# Patient Record
Sex: Male | Born: 1945 | Race: White | Hispanic: No | Marital: Married | Smoking: Former smoker
Health system: Southern US, Community
[De-identification: ages and names within clinical notes are randomized; demographics above are authoritative.]

## PROBLEM LIST (undated history)

## (undated) DIAGNOSIS — I452 Bifascicular block: Secondary | ICD-10-CM

## (undated) DIAGNOSIS — M1990 Unspecified osteoarthritis, unspecified site: Secondary | ICD-10-CM

## (undated) DIAGNOSIS — E119 Type 2 diabetes mellitus without complications: Secondary | ICD-10-CM

## (undated) DIAGNOSIS — R51 Headache: Secondary | ICD-10-CM

## (undated) DIAGNOSIS — E785 Hyperlipidemia, unspecified: Secondary | ICD-10-CM

## (undated) DIAGNOSIS — IMO0002 Reserved for concepts with insufficient information to code with codable children: Secondary | ICD-10-CM

## (undated) DIAGNOSIS — I10 Essential (primary) hypertension: Secondary | ICD-10-CM

## (undated) DIAGNOSIS — I25119 Atherosclerotic heart disease of native coronary artery with unspecified angina pectoris: Secondary | ICD-10-CM

## (undated) DIAGNOSIS — F329 Major depressive disorder, single episode, unspecified: Secondary | ICD-10-CM

## (undated) DIAGNOSIS — G5600 Carpal tunnel syndrome, unspecified upper limb: Secondary | ICD-10-CM

## (undated) HISTORY — DX: Reserved for concepts with insufficient information to code with codable children: IMO0002

## (undated) HISTORY — DX: Type 2 diabetes mellitus without complications: E11.9

## (undated) HISTORY — DX: Hyperlipidemia, unspecified: E78.5

## (undated) HISTORY — DX: Major depressive disorder, single episode, unspecified: F32.9

## (undated) HISTORY — PX: CARDIAC CATHETERIZATION: SHX172

## (undated) HISTORY — PX: CERVICAL SPINE SURGERY: SHX589

## (undated) HISTORY — DX: Essential (primary) hypertension: I10

## (undated) HISTORY — DX: Carpal tunnel syndrome, unspecified upper limb: G56.00

## (undated) HISTORY — DX: Bifascicular block: I45.2

## (undated) HISTORY — DX: Unspecified osteoarthritis, unspecified site: M19.90

## (undated) HISTORY — DX: Atherosclerotic heart disease of native coronary artery with unspecified angina pectoris: I25.119

## (undated) HISTORY — DX: Headache: R51

## (undated) HISTORY — PX: ELBOW SURGERY: SHX618

## (undated) HISTORY — PX: CORONARY ARTERY BYPASS GRAFT: SHX141

## (undated) HISTORY — PX: BACK SURGERY: SHX140

---

## 2000-10-23 ENCOUNTER — Encounter: Payer: Self-pay | Admitting: Cardiology

## 2000-10-23 ENCOUNTER — Encounter: Payer: Self-pay | Admitting: Thoracic Surgery (Cardiothoracic Vascular Surgery)

## 2000-10-23 ENCOUNTER — Inpatient Hospital Stay (HOSPITAL_COMMUNITY): Admission: EM | Admit: 2000-10-23 | Discharge: 2000-11-03 | Payer: Self-pay | Admitting: Emergency Medicine

## 2000-10-24 ENCOUNTER — Encounter: Payer: Self-pay | Admitting: Thoracic Surgery (Cardiothoracic Vascular Surgery)

## 2000-10-25 ENCOUNTER — Encounter: Payer: Self-pay | Admitting: Thoracic Surgery (Cardiothoracic Vascular Surgery)

## 2000-10-26 ENCOUNTER — Encounter: Payer: Self-pay | Admitting: Thoracic Surgery (Cardiothoracic Vascular Surgery)

## 2000-10-27 ENCOUNTER — Encounter: Payer: Self-pay | Admitting: Thoracic Surgery (Cardiothoracic Vascular Surgery)

## 2000-10-28 ENCOUNTER — Encounter: Payer: Self-pay | Admitting: Thoracic Surgery (Cardiothoracic Vascular Surgery)

## 2000-10-29 ENCOUNTER — Encounter: Payer: Self-pay | Admitting: Thoracic Surgery (Cardiothoracic Vascular Surgery)

## 2000-10-30 ENCOUNTER — Encounter: Payer: Self-pay | Admitting: Thoracic Surgery (Cardiothoracic Vascular Surgery)

## 2000-11-01 ENCOUNTER — Encounter: Payer: Self-pay | Admitting: Thoracic Surgery (Cardiothoracic Vascular Surgery)

## 2003-02-03 ENCOUNTER — Observation Stay (HOSPITAL_COMMUNITY): Admission: RE | Admit: 2003-02-03 | Discharge: 2003-02-04 | Payer: Self-pay | Admitting: Neurological Surgery

## 2003-02-05 ENCOUNTER — Inpatient Hospital Stay (HOSPITAL_COMMUNITY): Admission: EM | Admit: 2003-02-05 | Discharge: 2003-02-07 | Payer: Self-pay | Admitting: Emergency Medicine

## 2003-04-30 ENCOUNTER — Ambulatory Visit (HOSPITAL_BASED_OUTPATIENT_CLINIC_OR_DEPARTMENT_OTHER): Admission: RE | Admit: 2003-04-30 | Discharge: 2003-04-30 | Payer: Self-pay | Admitting: Orthopedic Surgery

## 2003-04-30 ENCOUNTER — Encounter: Admission: RE | Admit: 2003-04-30 | Discharge: 2003-04-30 | Payer: Self-pay | Admitting: Orthopedic Surgery

## 2003-04-30 ENCOUNTER — Ambulatory Visit (HOSPITAL_COMMUNITY): Admission: RE | Admit: 2003-04-30 | Discharge: 2003-04-30 | Payer: Self-pay | Admitting: Orthopedic Surgery

## 2010-09-16 DIAGNOSIS — R51 Headache: Secondary | ICD-10-CM

## 2010-09-16 DIAGNOSIS — R519 Headache, unspecified: Secondary | ICD-10-CM | POA: Insufficient documentation

## 2010-09-16 DIAGNOSIS — M1990 Unspecified osteoarthritis, unspecified site: Secondary | ICD-10-CM | POA: Insufficient documentation

## 2010-09-16 DIAGNOSIS — F32A Depression, unspecified: Secondary | ICD-10-CM

## 2010-09-16 DIAGNOSIS — E119 Type 2 diabetes mellitus without complications: Secondary | ICD-10-CM

## 2010-09-16 DIAGNOSIS — I10 Essential (primary) hypertension: Secondary | ICD-10-CM

## 2010-09-16 DIAGNOSIS — F329 Major depressive disorder, single episode, unspecified: Secondary | ICD-10-CM | POA: Insufficient documentation

## 2010-09-16 HISTORY — DX: Unspecified osteoarthritis, unspecified site: M19.90

## 2010-09-16 HISTORY — DX: Headache, unspecified: R51.9

## 2010-09-16 HISTORY — DX: Depression, unspecified: F32.A

## 2010-09-16 HISTORY — DX: Type 2 diabetes mellitus without complications: E11.9

## 2010-09-16 HISTORY — DX: Essential (primary) hypertension: I10

## 2010-09-20 DIAGNOSIS — IMO0002 Reserved for concepts with insufficient information to code with codable children: Secondary | ICD-10-CM

## 2010-09-20 HISTORY — DX: Reserved for concepts with insufficient information to code with codable children: IMO0002

## 2010-10-14 DIAGNOSIS — G5600 Carpal tunnel syndrome, unspecified upper limb: Secondary | ICD-10-CM

## 2010-10-14 HISTORY — DX: Carpal tunnel syndrome, unspecified upper limb: G56.00

## 2010-11-09 ENCOUNTER — Other Ambulatory Visit: Payer: Self-pay | Admitting: Neurological Surgery

## 2010-11-09 DIAGNOSIS — M47812 Spondylosis without myelopathy or radiculopathy, cervical region: Secondary | ICD-10-CM

## 2010-11-13 ENCOUNTER — Ambulatory Visit
Admission: RE | Admit: 2010-11-13 | Discharge: 2010-11-13 | Disposition: A | Discharge status: Home / Self Care | Payer: Medicare Other | Source: Ambulatory Visit | Attending: Neurological Surgery | Admitting: Neurological Surgery

## 2010-11-13 DIAGNOSIS — M47812 Spondylosis without myelopathy or radiculopathy, cervical region: Secondary | ICD-10-CM

## 2010-11-23 ENCOUNTER — Other Ambulatory Visit (HOSPITAL_COMMUNITY): Payer: Self-pay | Admitting: Neurological Surgery

## 2010-11-23 ENCOUNTER — Ambulatory Visit (HOSPITAL_COMMUNITY)
Admission: RE | Admit: 2010-11-23 | Discharge: 2010-11-23 | Disposition: A | Discharge status: Home / Self Care | Payer: Medicare Other | Source: Ambulatory Visit | Attending: Neurological Surgery | Admitting: Neurological Surgery

## 2010-11-23 ENCOUNTER — Encounter (HOSPITAL_COMMUNITY)
Admission: RE | Admit: 2010-11-23 | Discharge: 2010-11-23 | Disposition: A | Discharge status: Home / Self Care | Payer: Medicare Other | Source: Ambulatory Visit | Attending: Neurological Surgery | Admitting: Neurological Surgery

## 2010-11-23 DIAGNOSIS — M5030 Other cervical disc degeneration, unspecified cervical region: Secondary | ICD-10-CM

## 2010-11-23 DIAGNOSIS — Z01812 Encounter for preprocedural laboratory examination: Secondary | ICD-10-CM | POA: Insufficient documentation

## 2010-11-23 DIAGNOSIS — Z01818 Encounter for other preprocedural examination: Secondary | ICD-10-CM | POA: Insufficient documentation

## 2010-11-23 LAB — BASIC METABOLIC PANEL
BUN: 10 mg/dL (ref 6–23)
CO2: 29 mEq/L (ref 19–32)
Calcium: 9.9 mg/dL (ref 8.4–10.5)
Chloride: 103 mEq/L (ref 96–112)
Creatinine, Ser: 0.87 mg/dL (ref 0.50–1.35)
GFR calc Af Amer: >60 mL/min (ref >60)
GFR calc non Af Amer: >60 mL/min (ref >60)
Glucose, Bld: 104 mg/dL — ABNORMAL HIGH (ref 70–99)
Potassium: 4.3 mEq/L (ref 3.5–5.1)
Sodium: 141 mEq/L (ref 135–145)

## 2010-11-23 LAB — PROTIME-INR
INR: 1.06 (ref 0.00–1.49)
Prothrombin Time: 14 seconds (ref 11.6–15.2)

## 2010-11-23 LAB — CBC
HCT: 45.1 % (ref 39.0–52.0)
Hemoglobin: 15.6 g/dL (ref 13.0–17.0)
MCH: 30.5 pg (ref 26.0–34.0)
MCHC: 34.6 g/dL (ref 30.0–36.0)
MCV: 88.1 fL (ref 78.0–100.0)
Platelets: 251 10*3/uL (ref 150–400)
RBC: 5.12 MIL/uL (ref 4.22–5.81)
RDW: 12.5 % (ref 11.5–15.5)
WBC: 13.2 10*3/uL — ABNORMAL HIGH (ref 4.0–10.5)

## 2010-11-23 LAB — SURGICAL PCR SCREEN
MRSA, PCR: NEGATIVE
Staphylococcus aureus: NEGATIVE

## 2010-11-30 ENCOUNTER — Ambulatory Visit (HOSPITAL_COMMUNITY): Payer: Medicare Other

## 2010-11-30 ENCOUNTER — Inpatient Hospital Stay (HOSPITAL_COMMUNITY)
Admission: RE | Admit: 2010-11-30 | Discharge: 2010-12-01 | DRG: 473 | Disposition: A | Discharge status: Home / Self Care | Payer: Medicare Other | Source: Ambulatory Visit | Attending: Neurological Surgery | Admitting: Neurological Surgery

## 2010-11-30 DIAGNOSIS — Z87891 Personal history of nicotine dependence: Secondary | ICD-10-CM

## 2010-11-30 DIAGNOSIS — M4712 Other spondylosis with myelopathy, cervical region: Principal | ICD-10-CM | POA: Diagnosis present

## 2010-11-30 DIAGNOSIS — Z7982 Long term (current) use of aspirin: Secondary | ICD-10-CM

## 2010-11-30 DIAGNOSIS — Z79899 Other long term (current) drug therapy: Secondary | ICD-10-CM

## 2010-11-30 DIAGNOSIS — E119 Type 2 diabetes mellitus without complications: Secondary | ICD-10-CM | POA: Diagnosis present

## 2010-11-30 DIAGNOSIS — I2510 Atherosclerotic heart disease of native coronary artery without angina pectoris: Secondary | ICD-10-CM | POA: Diagnosis present

## 2010-11-30 DIAGNOSIS — I10 Essential (primary) hypertension: Secondary | ICD-10-CM | POA: Diagnosis present

## 2010-11-30 LAB — CBC
HCT: 46.1 % (ref 39.0–52.0)
Hemoglobin: 16.1 g/dL (ref 13.0–17.0)
MCH: 30.9 pg (ref 26.0–34.0)
MCHC: 34.9 g/dL (ref 30.0–36.0)
MCV: 88.5 fL (ref 78.0–100.0)
Platelets: 214 10*3/uL (ref 150–400)
RBC: 5.21 MIL/uL (ref 4.22–5.81)
RDW: 12.6 % (ref 11.5–15.5)
WBC: 12.4 10*3/uL — ABNORMAL HIGH (ref 4.0–10.5)

## 2010-11-30 LAB — GLUCOSE, CAPILLARY
Glucose-Capillary: 121 mg/dL — ABNORMAL HIGH (ref 70–99)
Glucose-Capillary: 153 mg/dL — ABNORMAL HIGH (ref 70–99)
Glucose-Capillary: 188 mg/dL — ABNORMAL HIGH (ref 70–99)
Glucose-Capillary: 181 mg/dL — ABNORMAL HIGH (ref 70–99)

## 2010-12-01 LAB — GLUCOSE, CAPILLARY: Glucose-Capillary: 160 mg/dL — ABNORMAL HIGH (ref 70–99)

## 2010-12-02 ENCOUNTER — Other Ambulatory Visit (HOSPITAL_COMMUNITY): Payer: Self-pay

## 2010-12-09 NOTE — Op Note (Signed)
Anthony Mcgee, WORD NO.:  [id number]  MEDICAL RECORD NO.:  [id number]  LOCATION:  3526                         FACILITY:  [facility]  PHYSICIAN:  Stefani Dama, M.D.  DATE OF BIRTH:  [birth year]  DATE OF PROCEDURE:  11/30/2010 DATE OF DISCHARGE:                              OPERATIVE REPORT   PREOPERATIVE DIAGNOSIS:  Cervical spondylosis with myelopathy, C3-C4.  POSTOPERATIVE DIAGNOSIS:  Cervical spondylosis with myelopathy, C3-C4.  PROCEDURE:  Anterior cervical decompression, C3-C4; arthrodesis with structural allograft and Alphatec plate fixation, C3-C4.  SURGEON:  Stefani Dama, MD  FIRST ASSISTANT:  Clydene Fake, MD  ANESTHESIA:  General endotracheal.  INDICATIONS:  Anthony Mcgee is a 65 year old individual who has had significant problems with walking and problems with strength in his upper extremities.  He had substantial problem with neck pain.  He had previous cervical spondylitic disease and has undergone decompression and fusion of the lower cervical segment.  His most recent problem let him to have an MRI of the cervical spine which demonstrates the patient has evidence of myelomalacia at the level of C3-C4 with a large spondylotic ridge secondary to bony osteophytic overgrowth in addition to marked disk degeneration.  He was advised of the urgent need for cervical spinal cord decompression as his condition has been deteriorating over the past number of months.  He is now taken to the operating room for this procedure.  DESCRIPTION OF PROCEDURE:  The patient was brought to the operating room and placed on table in a supine position.  After smooth induction of general endotracheal anesthesia, his neck was placed in a donut horseshoe head holder and extended only slightly.  The neck was then prepped with alcohol and DuraPrep and draped in a sterile fashion.  A transverse incision was created in the neck approximately a centimeter- and-half  above his previously made incision.  Dissection was carried down through the platysma and plane between sternocleidomastoid and the strap muscles were dissected bluntly until the prevertebral space was reached.  The first identifiable disk space was noted to be that of C3- C4 on localizing radiograph.  Then by stripping the longus colli muscle off either side of the midline in this area, a Caspar retractor could be placed underneath the muscular edges.  The disk space was then opened with a #15 blade and a combination of Kerrison rongeurs was used to evacuate the ventral degenerated disk material in this area and then we removed the edge of the osteophytic overgrowth from the ventral aspect of the vertebral body of C3.  The disk space was entered further and a complete diskectomy was carried out.  There was substantial osteophytic overgrowth from the inferior margin body of C3 as there was from the uncinate processes which were markedly hypertrophied.  These were drilled down and large uncinate spurs were removed from both the right and left sides.  This allowed further decompression of the central canal.  There was noted be redundant thickened posterior longitudinal ligament which was then opened with a 2-mm fine tip Kerrison rongeur and then the dissection was carried out underneath the vertebral body of C4 and underneath the vertebral body of  C3 after drilling cephalad to undercut the ventral osteophyte.  The central canal was thus decompressed in the area of the disk space at the C3-C4 level.  Self- retaining disk spreaders were placed from side-to-side to allow better visualization of the right side than the left side and then back to the right side several times in this fashion, so as to allow good decompression to the lateral gutters.  Hemostasis in the epidural spaces was then achieved with some small pledgets of Gelfoam soaked in thrombin which were allowed to sit in tamponade for  a few minutes, they were later irrigated away.  A barrel bit was then used to smooth the edges of the vertebral bodies and then ultimately an 8-mm transgraft was shaved to the appropriate size and configuration and filled with demineralized bone matrix and placed into the center of the interspinous space at C3- C4.  Then the lateral gutters were packed with some more demineralized bone matrix and a 14-mm standard size Alphatec plate of the Trestle variety was placed on the ventral aspect of the vertebral bodies and secured with 14 mm x 4 variable angle screws.  Final radiograph was then obtained to confirm the position of the graft and the nature of the construct when this was verified.  Soft-tissue hemostasis was achieved meticulously, then the platysma was closed with 3-0 Vicryl in interrupted fashion, and 3-0 Vicryl was used in the subcuticular tissues.  Dermabond was placed on the skin.  Blood loss for the procedure was estimated at less than 50 mL.     Stefani Dama, M.D.     Merla Riches  D:  11/30/2010  T:  11/30/2010  Job:  [id number]  Electronically Signed by Barnett Abu M.D. on 12/09/2010 03:17:08 PM

## 2010-12-13 ENCOUNTER — Inpatient Hospital Stay (HOSPITAL_COMMUNITY)
Admission: RE | Admit: 2010-12-13 | Discharge: 2010-12-21 | DRG: 490 | Disposition: A | Discharge status: Skilled Nursing Facility | Payer: Medicare Other | Source: Ambulatory Visit | Attending: Neurological Surgery | Admitting: Neurological Surgery

## 2010-12-13 ENCOUNTER — Inpatient Hospital Stay (HOSPITAL_COMMUNITY): Payer: Medicare Other

## 2010-12-13 DIAGNOSIS — M5126 Other intervertebral disc displacement, lumbar region: Secondary | ICD-10-CM | POA: Diagnosis present

## 2010-12-13 DIAGNOSIS — J4489 Other specified chronic obstructive pulmonary disease: Secondary | ICD-10-CM | POA: Diagnosis present

## 2010-12-13 DIAGNOSIS — J449 Chronic obstructive pulmonary disease, unspecified: Secondary | ICD-10-CM | POA: Diagnosis present

## 2010-12-13 DIAGNOSIS — Z7982 Long term (current) use of aspirin: Secondary | ICD-10-CM

## 2010-12-13 DIAGNOSIS — R339 Retention of urine, unspecified: Secondary | ICD-10-CM | POA: Diagnosis not present

## 2010-12-13 DIAGNOSIS — I4510 Unspecified right bundle-branch block: Secondary | ICD-10-CM | POA: Diagnosis present

## 2010-12-13 DIAGNOSIS — G8250 Quadriplegia, unspecified: Secondary | ICD-10-CM | POA: Diagnosis present

## 2010-12-13 DIAGNOSIS — G8929 Other chronic pain: Secondary | ICD-10-CM | POA: Diagnosis present

## 2010-12-13 DIAGNOSIS — R109 Unspecified abdominal pain: Secondary | ICD-10-CM | POA: Diagnosis present

## 2010-12-13 DIAGNOSIS — D62 Acute posthemorrhagic anemia: Secondary | ICD-10-CM | POA: Diagnosis not present

## 2010-12-13 DIAGNOSIS — M4712 Other spondylosis with myelopathy, cervical region: Secondary | ICD-10-CM | POA: Diagnosis present

## 2010-12-13 DIAGNOSIS — M48062 Spinal stenosis, lumbar region with neurogenic claudication: Principal | ICD-10-CM | POA: Diagnosis present

## 2010-12-13 DIAGNOSIS — Z951 Presence of aortocoronary bypass graft: Secondary | ICD-10-CM

## 2010-12-13 DIAGNOSIS — Z981 Arthrodesis status: Secondary | ICD-10-CM

## 2010-12-13 DIAGNOSIS — I2510 Atherosclerotic heart disease of native coronary artery without angina pectoris: Secondary | ICD-10-CM | POA: Diagnosis present

## 2010-12-13 DIAGNOSIS — I10 Essential (primary) hypertension: Secondary | ICD-10-CM | POA: Diagnosis present

## 2010-12-13 LAB — CBC
HCT: 41.9 % (ref 39.0–52.0)
Hemoglobin: 14.5 g/dL (ref 13.0–17.0)
MCH: 30.5 pg (ref 26.0–34.0)
MCHC: 34.6 g/dL (ref 30.0–36.0)
MCV: 88.2 fL (ref 78.0–100.0)
Platelets: 227 10*3/uL (ref 150–400)
RBC: 4.75 MIL/uL (ref 4.22–5.81)
RDW: 12.7 % (ref 11.5–15.5)
WBC: 9.7 10*3/uL (ref 4.0–10.5)

## 2010-12-13 LAB — BASIC METABOLIC PANEL
BUN: 9 mg/dL (ref 6–23)
CO2: 30 mEq/L (ref 19–32)
Calcium: 9.9 mg/dL (ref 8.4–10.5)
Chloride: 105 mEq/L (ref 96–112)
Creatinine, Ser: 0.86 mg/dL (ref 0.50–1.35)
GFR calc Af Amer: >60 mL/min (ref >60)
GFR calc non Af Amer: >60 mL/min (ref >60)
Glucose, Bld: 116 mg/dL — ABNORMAL HIGH (ref 70–99)
Potassium: 4.6 mEq/L (ref 3.5–5.1)
Sodium: 142 mEq/L (ref 135–145)

## 2010-12-13 LAB — GLUCOSE, CAPILLARY
Glucose-Capillary: 97 mg/dL (ref 70–99)
Glucose-Capillary: 141 mg/dL — ABNORMAL HIGH (ref 70–99)
Glucose-Capillary: 176 mg/dL — ABNORMAL HIGH (ref 70–99)
Glucose-Capillary: 205 mg/dL — ABNORMAL HIGH (ref 70–99)

## 2010-12-14 LAB — GLUCOSE, CAPILLARY
Glucose-Capillary: 115 mg/dL — ABNORMAL HIGH (ref 70–99)
Glucose-Capillary: 142 mg/dL — ABNORMAL HIGH (ref 70–99)

## 2010-12-15 ENCOUNTER — Inpatient Hospital Stay (HOSPITAL_COMMUNITY): Payer: Medicare Other

## 2010-12-15 LAB — CBC
HCT: 35.5 % — ABNORMAL LOW (ref 39.0–52.0)
MCH: 30.2 pg (ref 26.0–34.0)
MCV: 89.2 fL (ref 78.0–100.0)
Platelets: 194 10*3/uL (ref 150–400)
RDW: 12.8 % (ref 11.5–15.5)

## 2010-12-15 LAB — BASIC METABOLIC PANEL
BUN: 11 mg/dL (ref 6–23)
CO2: 28 mEq/L (ref 19–32)
Calcium: 9 mg/dL (ref 8.4–10.5)
Chloride: 106 mEq/L (ref 96–112)
Creatinine, Ser: 0.81 mg/dL (ref 0.50–1.35)
GFR calc Af Amer: >60 mL/min (ref >60)

## 2010-12-15 LAB — GLUCOSE, CAPILLARY
Glucose-Capillary: 103 mg/dL — ABNORMAL HIGH (ref 70–99)
Glucose-Capillary: 132 mg/dL — ABNORMAL HIGH (ref 70–99)
Glucose-Capillary: 182 mg/dL — ABNORMAL HIGH (ref 70–99)

## 2010-12-15 LAB — URINALYSIS, ROUTINE W REFLEX MICROSCOPIC
Bilirubin Urine: NEGATIVE
Ketones, ur: NEGATIVE mg/dL
Nitrite: NEGATIVE
Specific Gravity, Urine: 1.016 (ref 1.005–1.030)
pH: 6.5 (ref 5.0–8.0)

## 2010-12-15 LAB — URINE MICROSCOPIC-ADD ON

## 2010-12-16 LAB — URINE CULTURE
Colony Count: NO GROWTH
Culture  Setup Time: 201209121911
Culture: NO GROWTH

## 2010-12-16 LAB — GLUCOSE, CAPILLARY: Glucose-Capillary: 180 mg/dL — ABNORMAL HIGH (ref 70–99)

## 2010-12-17 LAB — GLUCOSE, CAPILLARY: Glucose-Capillary: 112 mg/dL — ABNORMAL HIGH (ref 70–99)

## 2010-12-18 LAB — GLUCOSE, CAPILLARY
Glucose-Capillary: 116 mg/dL — ABNORMAL HIGH (ref 70–99)
Glucose-Capillary: 111 mg/dL — ABNORMAL HIGH (ref 70–99)

## 2010-12-19 LAB — GLUCOSE, CAPILLARY: Glucose-Capillary: 118 mg/dL — ABNORMAL HIGH (ref 70–99)

## 2010-12-20 LAB — GLUCOSE, CAPILLARY
Glucose-Capillary: 170 mg/dL — ABNORMAL HIGH (ref 70–99)
Glucose-Capillary: 130 mg/dL — ABNORMAL HIGH (ref 70–99)
Glucose-Capillary: 155 mg/dL — ABNORMAL HIGH (ref 70–99)
Glucose-Capillary: 130 mg/dL — ABNORMAL HIGH (ref 70–99)
Glucose-Capillary: 101 mg/dL — ABNORMAL HIGH (ref 70–99)

## 2010-12-21 LAB — GLUCOSE, CAPILLARY: Glucose-Capillary: 128 mg/dL — ABNORMAL HIGH (ref 70–99)

## 2011-02-02 NOTE — Op Note (Addendum)
[name], [name]                ACCOUNT NO.:  [id number]  MEDICAL RECORD NO.:  [id number]  LOCATION:                                 FACILITY:  PHYSICIAN:  Stefani Dama, M.D.  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  PROCEDURE:  Bilateral laminotomies L2-L3 with diskectomy L2-3, right L3- 4 laminotomy, foraminotomy and diskectomy with operating microscope and microdissection technique.  SURGEON:  Stefani Dama, MD  FIRST ASSISTANT:  Danae Orleans. Venetia Maxon, MD  ANESTHESIA:  General endotracheal.  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  He has had previous surgical decompression of the cervical spine C4 down to C7.  He had a large centrally ruptured disk at C3-4 that was decompressed a few weeks ago.  He also complained of severe bilateral lower extremity weakness and on MRI imaging of the lumbar spine was found to have a large centrally herniated disk at L3-4 causing severe central canal compromise and he had a right-sided disk protrusion in addition to advanced spondylitic changes at L3-L4.  He was advised regarding the need for decompression of these areas and now is taken to the operating room to undergo same procedure.  DESCRIPTION OF PROCEDURE:  The patient was brought to the operating room supine on the stretcher.  After smooth induction of general endotracheal anesthesia, he was turned prone.  The back was prepped  with alcohol, DuraPrep, and draped in sterile fashion.  Vertical incision was then made overlying the L3-4 and L2-3 regions which were localized with needle localization radiography.  L3-4 and L2-3 interspaces were identified and laminotomies were created in these areas and second radiograph was obtained to confirm the proper location.  Then once laminotomies were created on the right side at L3-4 and L2-3, the left side laminotomy was created.  The ligament was then taken up and the common dural tube was explored. At L3-4, the canal was noted be very stenotic and the dura was noted to be bulged dorsally.  By dissecting along the lateral aspects, we were able to identify the disk space and the disk itself was noted to be very boggy and when incised several fragments of disk extruded itself.  Further dissection yielded other loose and degenerated fragments of disk and the ligament along with this was removed to allow for decompression of the central canal at the level of L2-3.  At L3-4, laminotomy was created and there was noted to be significant hypertrophy of the facet with thickened redundancy of the ligament causing lateral recess stenosis.  As this area was decompressed, it was noted that the nerve traveled over a substantial mass which was then identified at the disk herniation itself and L3-4. This was resected.  The disk space  itself was noted to be markedly collapsed.  The operating microscope was used for this portion of procedure with Dr. Venetia Maxon providing retraction and working in the lateral aspects of disk space at L2-3 while I worked medially.  The opposite laminotomy was similarly opened and decompressed and gradually more room for the central canal and takeoff of the L3 nerve roots inferiorly, L2 nerve root superiorly were cleared.  Curved Kerrison punches were used to remove overhang of the superior articular process of L3 which abutted into the L2 foramen.  Once these  areas cleared and decompressed, each of the individual nerve roots of L2 superiorly, L3 inferiorly, and L4 on the right side inferiorly were checked to be free and clear.  With the hemostasis in the epidural spaces being achieved meticulously, the wound was irrigated copiously again and after good decompression was assured, the disk space was emptied.  We then proceeded to close the wound with #1 Vicryl interrupted fashion and lumbodorsal fascia, 2-0 Vicryl in the subcutaneous tissues.  A 20 mL of 0.5% Marcaine was injected into the lumbodorsal fascia prior to closure and a 3-0 Vicryl was used to close the subcuticular skin.  Dermabond was placed on the skin.  The patient tolerated the procedure well and returned to recovery in stable condition.     Stefani Dama, M.D.     Merla Riches  D:  12/13/2010  T:  12/13/2010  Job:  [id number]  Electronically Signed by Barnett Abu M.D. on 02/06/2011 10:21:09 PM

## 2011-02-02 NOTE — Procedures (Signed)
NAMEELEX, MAINWARING NO.:  [id number]  MEDICAL RECORD NO.:  [id number]  LOCATION:  3019                         FACILITY:  [facility]  PHYSICIAN:  Stefani Dama, M.D.  DATE OF BIRTH:  1946-03-28  DATE of Admission:  12/13/2010  DATE OF DISCHARGE:  12/21/2010                     Discharge Summary   Admitting and Discharge DIAGNOSES: 1. 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 2. Cervical myelopathy with quadriparesis. 3. 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 4. Osteoarthritis.  HOSPITAL COURSE:  [name] is a 65 year old individual who has had progressive deterioration of gait with severe back pain, bilateral lower extremity pain, weakness in his arm.  His workup started a little over a month ago with some plain x-rays of his back, which demonstrated severe spondylosis.  Because he had weakness in the arm, an MRI of the cervical spine and an MRI of the lumbar spine was performed, and the cervical spine demonstrated severe stenosis at the level of C3-C4 with cord compression and myelomalacia within the cord.  The patient was advised regarding the urgent need for surgical decompression of his neck and this was performed on November 30, 2010.  After the surgery, the patient had episodes of urinary retention.  One of his initial complaints was that he had severe difficulty voiding, and he was seen by Dr. Alfredo Martinez, the urologist who started the patient on Flomax. The patient was allowed to recover from his cervical surgery and after a brief rest, he was brought to the hospital on December 13, 2010, for decompression of the lumbar spine where he had a severe spondylitic stenosis at the level of L2-L3 secondary to herniated disk, and he had moderately severe stenosis at L3-L4 secondary to combination of spondylosis and facet hypertrophy.  He underwent surgical  decompression of both these levels.  During the postoperative course, the patient was noted to have urinary retention and required initial straight catheterization, but as soon it became apparent, he would not be able to void and a Foley catheter was placed.  The patient also complained of significant weakness in both his arms and his legs and because of this, he was seen by physical therapy and was allowed to remain in the hospital, became apparent that because of his debilitation with weakness in his arms and his legs that he would require some substantial amounts of physical therapy.  At this point, on December 17, 2010, we decided to enroll with the help of social services to look for placement.  It was initially planned that the patient would go to the home of the son where he would be allowed to recuperate because of the needs for close monitoring because of his tendency to fall, weakness with ambulation, and need for help with activities of daily living.  It is felt that a brief stay in the rehabilitation will help the patient significantly. During all this time, attempts at removal of Foley catheter have been unsuccessful, and the patient persists with urinary retention.  We contacted Dr. Lorin Picket MacDiarmid suggested that the patient be placed on Foley catheter for his rehabilitate stay.  He be  off of the Flomax and in a few weeks when he has recovered some from the surgical intervention on outpatient visit, we plan with Dr. Sherron Monday removal of the catheter and check his voiding parameters.  The patient was to have some outpatient urodynamic studies.  However, they are put on hold because of the need for the spinal surgery.  At the time of discharge, the patient's incision is clean and dry.  His leg strength is graded at 4/5 in the major groups that is the iliopsoas, quadriceps, tibialis anterior, and gastroc.  His right upper extremity strength is graded at 4/5.  He has  difficulty with coordination of fine movements in the right upper extremity, more so than the left upper extremity.  Left upper extremity strength appears to be quite intact.  There is a hope that with the help of physical therapy, some passage of time, his neurologic function should improve.  MEDICATIONS AT THE TIME OF DISCHARGE: 1. Mavik 4 mg a day. 2. Ropinirole 0.25 mg daily. 3. Cyclobenzaprine 5 mg as needed for spasms. 4. Enteric-coated aspirin 81 mg once daily. 5. In addition, the patient has a prescription for Percocet 5/325 one     tablet every 4-6 hours as needed for pain and valium 5 mg q.6 h. as     needed for muscle spasm.  I would like to see the patient in followup in approximately 3 weeks' time.     Stefani Dama, M.D.     Anthony Mcgee  D:  12/20/2010  T:  12/21/2010  Job:  [id number]  Electronically Signed by Barnett Abu M.D. on 02/02/2011 06:51:45 AM

## 2011-12-30 IMAGING — CR DG LUMBAR SPINE 2-3V
1 series · 1 of 1 positions shown · non-contrast
Comparison: MR lumbar spine 07/27/2010 and lumbar spine series
04/15/2008.

CLINICAL DATA: L2-3 discectomy and L3-4 laminectomy and
foraminotomy.

LUMBAR SPINE - 2-3 VIEW

[view not recorded]
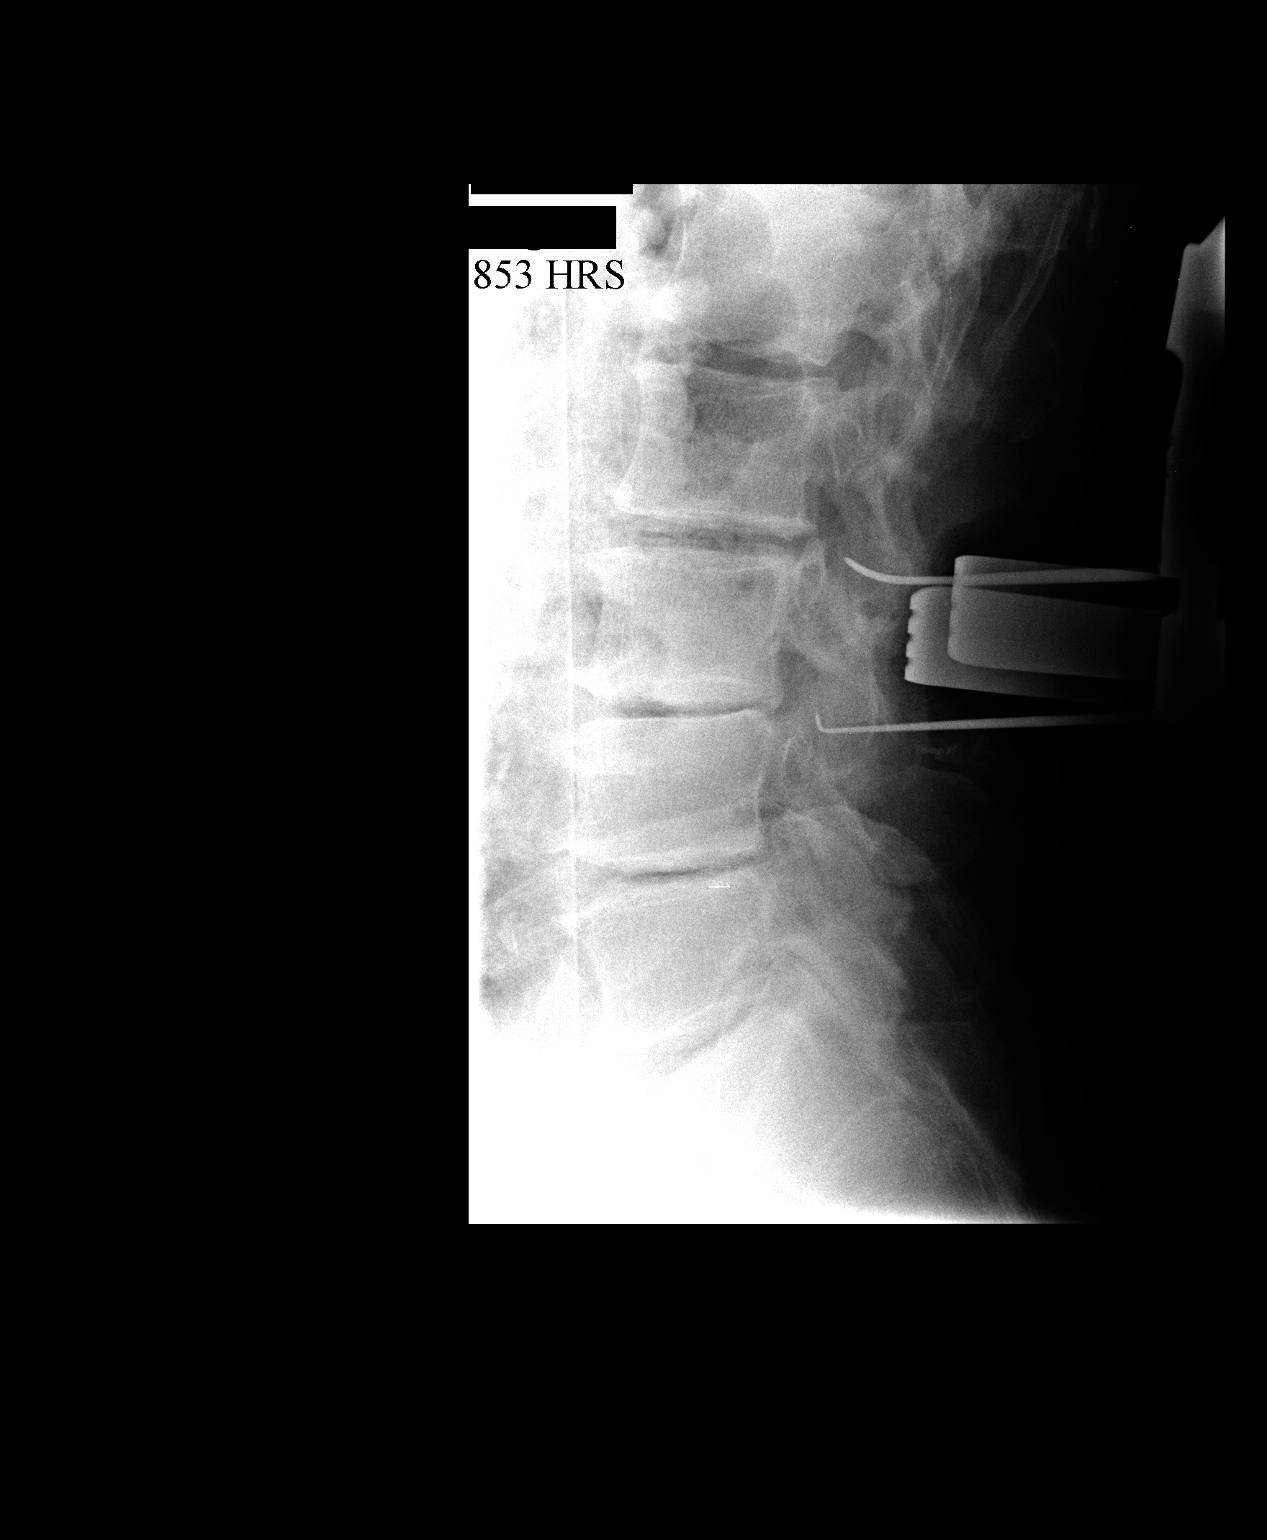

[1 of 1 positions shown; findings below may reference images not displayed]

FINDINGS: Two intraoperative cross-table lateral views of the
lumbar spine are submitted.  The first film, taken at 6416 hours,
shows a surgical instrument tip projecting over the L3 spinous
process.  Multilevel endplate degenerative changes and disc space
narrowing are noted.  Facet hypertrophy is worst at L5-S1.

The second film, taken at 7718 hours, shows surgical instrument
tips projecting posterior to the L2-3 and L3-4 disc spaces.
IMPRESSION: Intraoperative visualization for lumbar surgery.

## 2015-06-16 DIAGNOSIS — I25119 Atherosclerotic heart disease of native coronary artery with unspecified angina pectoris: Secondary | ICD-10-CM

## 2015-06-16 DIAGNOSIS — I452 Bifascicular block: Secondary | ICD-10-CM

## 2015-06-16 DIAGNOSIS — E785 Hyperlipidemia, unspecified: Secondary | ICD-10-CM

## 2015-06-16 HISTORY — DX: Atherosclerotic heart disease of native coronary artery with unspecified angina pectoris: I25.119

## 2015-06-16 HISTORY — DX: Bifascicular block: I45.2

## 2015-06-16 HISTORY — DX: Hyperlipidemia, unspecified: E78.5

## 2015-12-23 ENCOUNTER — Ambulatory Visit (INDEPENDENT_AMBULATORY_CARE_PROVIDER_SITE_OTHER): Payer: Medicare HMO

## 2015-12-23 ENCOUNTER — Ambulatory Visit (INDEPENDENT_AMBULATORY_CARE_PROVIDER_SITE_OTHER): Payer: Medicare HMO | Admitting: Sports Medicine

## 2015-12-23 ENCOUNTER — Encounter: Payer: Self-pay | Admitting: Sports Medicine

## 2015-12-23 VITALS — Resp 18 | Ht 67.0 in | Wt 185.0 lb

## 2015-12-23 DIAGNOSIS — M722 Plantar fascial fibromatosis: Secondary | ICD-10-CM | POA: Diagnosis not present

## 2015-12-23 DIAGNOSIS — M79672 Pain in left foot: Secondary | ICD-10-CM | POA: Diagnosis not present

## 2015-12-23 MED ORDER — MELOXICAM 15 MG PO TABS: 15.0000 mg | ORAL_TABLET | Freq: Every day | ORAL | 0 refills | Status: DC

## 2015-12-23 MED ORDER — TRIAMCINOLONE ACETONIDE 10 MG/ML IJ SUSP: 10.0000 mg | Freq: Once | INTRAMUSCULAR | Status: AC

## 2015-12-23 NOTE — Progress Notes (Signed)
Subjective: Anthony Mcgee is a 70 y.o. male patient presents to office with complaint of heel pain on the left. Patient admits to post static dyskinesia for >2 months in duration. Patient has treated this problem with stretches as told by Dr. Deberah Castle with no relief. Denies any other pedal complaints.   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   Patient Active Problem List   Diagnosis Date Noted  . Coronary artery disease involving native coronary artery of native heart with angina pectoris (HCC) 06/16/2015  . Dyslipidemia 06/16/2015  . RBBB (right bundle branch block with left anterior fascicular block) 06/16/2015  . Carpal tunnel syndrome 10/14/2010  . 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 Arthritis 09/16/2010  . Depression 09/16/2010  . Essential hypertension 09/16/2010  . Headache 09/16/2010  . Type 2 diabetes mellitus (HCC) 09/16/2010    No current outpatient prescriptions on file prior to visit.   No current facility-administered medications on file prior to visit.     Allergies  Allergen Reactions  . Codeine     Objective: Physical Exam General: The patient is alert and oriented x3 in no acute distress.  Dermatology: Skin is warm, dry and supple bilateral lower extremities. Nails 1-10 are normal. There is no erythema, edema, no eccymosis, no open lesions present. Integument is otherwise unremarkable.  Vascular: Dorsalis Pedis pulse and Posterior Tibial pulse are 1/4 bilateral. Capillary fill time is immediate to all digits.  Neurological: Grossly intact to light touch with an achilles reflex of +2/5 and a negative Tinel's sign bilateral.  Musculoskeletal: Tenderness to palpation at the medial calcaneal tubercale and through the insertion of the plantar fascia on the left foot. No pain with compression of calcaneus bilateral. No pain with tuning fork to calcaneus bilateral. No pain with calf compression bilateral. There is decreased  Ankle joint range of motion bilateral. All other joints range of motion within normal limits bilateral. + Bunion and hammertoe L>R. Strength 5/5 in all groups bilateral.   Gait: Unassisted, Antalgic avoid weight on left heel  Xray, Left foot:  Normal osseous mineralization. Joint spaces preserved except at midfoot. + Bunion and hammertoe. No fracture/dislocation/boney destruction. Calcaneal spur present with mild thickening of plantar fascia. No other soft tissue abnormalities or radiopaque foreign bodies.   Assessment and Plan: Problem List Items Addressed This Visit    None    Visit Diagnoses    Left foot pain    -  Primary   Relevant Medications   triamcinolone acetonide (KENALOG) 10 MG/ML injection 10 mg (Start on 12/23/2015  9:45 AM)   meloxicam (MOBIC) 15 MG tablet   Other Relevant Orders   DG Foot 2 Views Left   Plantar fasciitis of left foot       Relevant Medications   triamcinolone acetonide (KENALOG) 10 MG/ML injection 10 mg (Start on 12/23/2015  9:45 AM)   meloxicam (MOBIC) 15 MG tablet      -Complete examination performed.  -Xrays reviewed -Discussed with patient in detail the condition of plantar fasciitis, how this occurs and general treatment options. Explained both conservative and surgical treatments.  -After oral consent and aseptic prep, injected a mixture containing 1 ml of 2% plain lidocaine, 1 ml 0.5% plain marcaine, 0.5 ml of kenalog 10 and 0.5 ml of dexamethasone phosphate into left heel. Post-injection care discussed with patient.  -Rx Meloxicam to take as instructed  -Recommended good supportive shoes and advised use of OTC insert. Explained to patient  that if these orthoses work well, we will continue with these. If these do not improve his condition and  pain, we will consider custom molded orthoses. - Explained in detail the use of the fascial brace, left foot which was dispensed at today's visit. -Explained and dispensed to patient daily stretching  exercises. -Recommend patient to ice affected area 1-2x daily. -Patient to return to office in 3-4 weeks for follow up or sooner if problems or questions arise.  Asencion Islamitorya Carlus Stay, DPM

## 2015-12-23 NOTE — Patient Instructions (Signed)

## 2015-12-31 ENCOUNTER — Ambulatory Visit (INDEPENDENT_AMBULATORY_CARE_PROVIDER_SITE_OTHER): Payer: Medicare HMO | Admitting: Sports Medicine

## 2015-12-31 ENCOUNTER — Encounter: Payer: Self-pay | Admitting: Sports Medicine

## 2015-12-31 DIAGNOSIS — M722 Plantar fascial fibromatosis: Secondary | ICD-10-CM

## 2015-12-31 DIAGNOSIS — M79672 Pain in left foot: Secondary | ICD-10-CM

## 2015-12-31 MED ORDER — MELOXICAM 15 MG PO TABS: 15.0000 mg | ORAL_TABLET | Freq: Every day | ORAL | 0 refills | Status: DC

## 2015-12-31 MED ORDER — TRIAMCINOLONE ACETONIDE 40 MG/ML IJ SUSP: 20.0000 mg | Freq: Once | INTRAMUSCULAR | Status: AC

## 2015-12-31 MED ORDER — METHYLPREDNISOLONE 4 MG PO TBPK: ORAL_TABLET | ORAL | 0 refills | Status: DC

## 2015-12-31 NOTE — Progress Notes (Signed)
Subjective: Anthony Mcgee is a 70 y.o. male patient retruns to office with complaint of heel pain on the left. States that it hurts now more on the outside of the heel.Patient has been using brace, icing, stretching, wearing heel cushions with relief of medial heel pain but states now has pain on lateral heel; Admits that some days it does not hurt and then other days it hurts to walk/stand on heel. Denies any other pedal complaints.   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   Patient Active Problem List   Diagnosis Date Noted  . Coronary artery disease involving native coronary artery of native heart with angina pectoris (HCC) 06/16/2015  . Dyslipidemia 06/16/2015  . RBBB (right bundle branch block with left anterior fascicular block) 06/16/2015  . Carpal tunnel syndrome 10/14/2010  . 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 Arthritis 09/16/2010  . Depression 09/16/2010  . Essential hypertension 09/16/2010  . Headache 09/16/2010  . Type 2 diabetes mellitus (HCC) 09/16/2010    Current Outpatient Prescriptions on File Prior to Visit  Medication Sig Dispense Refill  . allopurinol (ZYLOPRIM) 300 MG tablet Take 300 mg by mouth.    Marland Kitchen aspirin EC 81 MG tablet 81 mg.    . gabapentin (NEURONTIN) 300 MG capsule 600 mg.    . levofloxacin (LEVAQUIN) 750 MG tablet Take 750 mg by mouth daily.  0  . metFORMIN (GLUCOPHAGE) 500 MG tablet Take 500 mg by mouth.    . predniSONE (DELTASONE) 20 MG tablet TAKE ONE TABLET BY MOUTH TWICE DAILY FOR FIVE DAYS  0  . rOPINIRole (REQUIP) 1 MG tablet 0.25 mg.    . simvastatin (ZOCOR) 20 MG tablet Take 20 mg by mouth.    . tamsulosin (FLOMAX) 0.4 MG CAPS capsule 0.4 mg.    . traMADol (ULTRAM) 50 MG tablet TAKE 1 TO 2 TABLETS BY MOUTH TWICE DAILY AS NEEDED FOR SEVERE PAIN  0  . trandolapril (MAVIK) 4 MG tablet 4 mg.     Current Facility-Administered Medications on File Prior to Visit  Medication  Dose Route Frequency Provider Last Rate Last Dose  . triamcinolone acetonide (KENALOG) 10 MG/ML injection 10 mg  10 mg Other Once Asencion Islam, DPM        Allergies  Allergen Reactions  . Codeine     Objective: Physical Exam General: The patient is alert and oriented x3 in no acute distress.  Dermatology: Skin is warm, dry and supple bilateral lower extremities. Nails 1-10 are normal. There is no erythema, edema, no eccymosis, no open lesions present. Integument is otherwise unremarkable.  Vascular: Dorsalis Pedis pulse and Posterior Tibial pulse are 1/4 bilateral. Capillary fill time is immediate to all digits.  Neurological: Grossly intact to light touch with an achilles reflex of +2/5 and a negative Tinel's sign bilateral. + Restless leg left>right history of multiple back surgeries.   Musculoskeletal: No tenderness to palpation at the medial calcaneal tubercale however there is pain at the lateral calcaneal tubercale and through the insertion of the plantar fascia on the left foot, likely compensation. No pain with compression of calcaneus bilateral. No pain with tuning fork to calcaneus bilateral. No pain with calf compression bilateral. There is decreased Ankle joint range of motion bilateral. All other joints range of motion within normal limits bilateral. + Bunion and hammertoe L>R. Strength 5/5 in all groups bilateral.   Assessment and Plan: Problem List Items Addressed This  Visit    None    Visit Diagnoses    Plantar fasciitis of left foot    -  Primary   lateral>medial band this time   Relevant Medications   triamcinolone acetonide (KENALOG-40) injection 20 mg   methylPREDNISolone (MEDROL DOSEPAK) 4 MG TBPK tablet   Left foot pain       Relevant Medications   triamcinolone acetonide (KENALOG-40) injection 20 mg   methylPREDNISolone (MEDROL DOSEPAK) 4 MG TBPK tablet   meloxicam (MOBIC) 15 MG tablet     -Complete examination performed.  -Discussed with patient in  detail the condition of lateral band compensation plantar fasciitis, how this occurs and general treatment options. Explained both conservative and surgical treatments.  -After oral consent and aseptic prep, injected a mixture containing 1 ml of 2% plain lidocaine, 1 ml 0.5% plain marcaine, 0.5 ml of kenalog 40 and 0.5 ml of dexamethasone phosphate into left heel at lateral aspect. Post-injection care discussed with patient.  -Rx Medrol dosepak with close monitoring of sugars since diabetic and continue with Meloxicam to take as instructed  -Recommended good supportive shoes and advised use of OTC insert.  -Continue with fascial brace to left foot -Continue with daily stretching exercises. -Recommend patient to ice affected area 1-2x daily. -Patient to return to office in 2 weeks for follow up or sooner if problems or questions arise.  Asencion Islamitorya Hadleigh Felber, DPM

## 2016-01-13 ENCOUNTER — Ambulatory Visit: Payer: Medicare HMO | Admitting: Sports Medicine

## 2017-09-28 DIAGNOSIS — K219 Gastro-esophageal reflux disease without esophagitis: Secondary | ICD-10-CM | POA: Insufficient documentation

## 2017-09-28 DIAGNOSIS — I319 Disease of pericardium, unspecified: Secondary | ICD-10-CM

## 2017-09-28 HISTORY — DX: Gastro-esophageal reflux disease without esophagitis: K21.9

## 2017-09-28 HISTORY — DX: Disease of pericardium, unspecified: I31.9

## 2017-10-30 NOTE — Progress Notes (Signed)
Cardiology Office Note:    Date:  10/31/2017   ID:  Anthony Mcgee, DOB 06/16/1945, MRN [id number]  PCP:  Maris Berger, MD  Cardiologist:  Norman Herrlich, MD    Referring MD: Maris Berger, MD    ASSESSMENT:    1. Coronary artery disease involving native coronary artery of native heart with angina pectoris (HCC)   2. Essential hypertension   3. RBBB (right bundle branch block with left anterior fascicular block)   4. Dyslipidemia    PLAN:    In order of problems listed above: 1. Stable asymptomatic continue current medical treatment this time I do not think he requires consideration of ischemia evaluation 2. Stable blood pressure target continue current treatment 3. Improved today's EKG is normal is a very unusual progression of EKG changes 4. Stable continue statin requested recent labs for safety liver function efficacy lipid profile from his PCP   Next appointment: One year or sooner if he is having frequent angina   Medication Adjustments/Labs and Tests Ordered: Current medicines are reviewed at length with the patient today.  Concerns regarding medicines are outlined above.  Orders Placed This Encounter  Procedures  . EKG 12-Lead   No orders of the defined types were placed in this encounter.   Chief Complaint  Patient presents with  . Annual Exam  . Coronary Artery Disease  . Hypertension  . Hyperlipidemia    History of Present Illness:    Anthony Mcgee is a 72 y.o. male with a hx of CAD, CABG 2000 RBBB and LAHB hypertension T2 DM and hyperlipidemia last seen 06/16/16 at [facility] cardiology. 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 Arthritis 09/16/2010  . Carpal tunnel syndrome 10/14/2010  . Coronary artery disease  involving native coronary artery of native heart with angina pectoris (HCC) 06/16/2015   Overview:  Lexiscan MPS 08/18/13: normal perfusion, EF 63%  . Depression 09/16/2010  . Dyslipidemia 06/16/2015  . Essential hypertension 09/16/2010  . Headache 09/16/2010  . RBBB (right bundle branch block with left anterior fascicular block) 06/16/2015  . 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 Type 2 diabetes mellitus (HCC) 09/16/2010    Past Surgical History:  Procedure Laterality Date  . BACK SURGERY    . CARDIAC CATHETERIZATION    . CERVICAL SPINE SURGERY    . CORONARY ARTERY BYPASS GRAFT    . ELBOW SURGERY      Current Medications: Current Meds  Medication Sig  . allopurinol (ZYLOPRIM) 300 MG tablet Take 300 mg by mouth.  Marland Kitchen aspirin EC 81 MG tablet 81 mg.  . gabapentin (NEURONTIN) 300 MG capsule 600 mg.  . metFORMIN (GLUCOPHAGE) 500 MG tablet Take 500 mg by mouth 2 (two) times daily with a meal.   . rOPINIRole (REQUIP) 1 MG tablet Take 1 mg by mouth at bedtime.   . simvastatin (ZOCOR) 20 MG tablet Take 20 mg by mouth daily at 6 PM.   . tamsulosin (FLOMAX) 0.4 MG CAPS capsule Take 0.4 mg by mouth daily.   . traMADol (ULTRAM) 50 MG tablet TAKE 1 TO 2 TABLETS BY MOUTH TWICE DAILY AS NEEDED FOR SEVERE PAIN  . trandolapril (MAVIK) 4 MG tablet Take 4 mg by mouth daily.    Current Facility-Administered Medications  for the 10/31/17 encounter (Office Visit) with Baldo DaubMunley, Blakelee Allington J, MD  Medication  . triamcinolone acetonide (KENALOG) 10 MG/ML injection 10 mg  . triamcinolone acetonide (KENALOG-40) injection 20 mg     Allergies:   Codeine   Social History   Socioeconomic History  . Marital status: Married    Spouse name: Not on file  . Number of children: Not on file  . Years of education: Not on file  . Highest education level: Not on file  Occupational History  . Not on file  Social Needs  . Financial resource strain: Not on file  . Food insecurity:    Worry: Not on  file    Inability: Not on file  . Transportation needs:    Medical: Not on file    Non-medical: Not on file  Tobacco Use  . Smoking status: Former Games developermoker  . Smokeless tobacco: Never Used  Substance and Sexual Activity  . Alcohol use: Not Currently  . Drug use: Not Currently  . Sexual activity: Not on file  Lifestyle  . Physical activity:    Days per week: Not on file    Minutes per session: Not on file  . Stress: Not on file  Relationships  . Social connections:    Talks on phone: Not on file    Gets together: Not on file    Attends religious service: Not on file    Active member of club or organization: Not on file    Attends meetings of clubs or organizations: Not on file    Relationship status: Not on file  Other Topics Concern  . Not on file  Social History Narrative  . Not on file     Family History: The patient's family history includes Heart attack in his father; Heart failure in his father and mother; Hyperlipidemia in his father and mother; Hypertension in his father and mother. ROS:   Please see the history of present illness.    All other systems reviewed and are negative.  EKGs/Labs/Other Studies Reviewed:    The following studies were reviewed today:  EKG:  EKG ordered today.  The ekg ordered today demonstrates SRTH and normal.  Recent Labs: No results found for requested labs within last 8760 hours.  Recent Lipid Panel No results found for: CHOL, TRIG, HDL, CHOLHDL, VLDL, LDLCALC, LDLDIRECT  Physical Exam:    VS:  BP 110/70 (BP Location: Left Arm, Patient Position: Sitting, Cuff Size: Normal)   Pulse 66   Ht 5\' 7"  (1.702 m)   Wt 184 lb (83.5 kg)   SpO2 95%   BMI 28.82 kg/m     Wt Readings from Last 3 Encounters:  10/31/17 184 lb (83.5 kg)  12/23/15 185 lb (83.9 kg)     GEN:  Well nourished, well developed in no acute distress HEENT: Normal NECK: No JVD; No carotid bruits LYMPHATICS: No lymphadenopathy CARDIAC: RRR, no murmurs, rubs,  gallops RESPIRATORY:  Clear to auscultation without rales, wheezing or rhonchi  ABDOMEN: Soft, non-tender, non-distended MUSCULOSKELETAL:  No edema; No deformity  SKIN: Warm and dry NEUROLOGIC:  Alert and oriented x 3 PSYCHIATRIC:  Normal affect    Signed, Norman HerrlichBrian Kristan Brummitt, MD  10/31/2017 1:03 PM    [facility]

## 2017-10-31 ENCOUNTER — Encounter: Payer: Self-pay | Admitting: Cardiology

## 2017-10-31 ENCOUNTER — Ambulatory Visit: Payer: Medicare Other | Admitting: Cardiology

## 2017-10-31 VITALS — BP 110/70 | HR 66 | Ht 67.0 in | Wt 184.0 lb

## 2017-10-31 DIAGNOSIS — I25119 Atherosclerotic heart disease of native coronary artery with unspecified angina pectoris: Secondary | ICD-10-CM | POA: Diagnosis not present

## 2017-10-31 DIAGNOSIS — E785 Hyperlipidemia, unspecified: Secondary | ICD-10-CM | POA: Diagnosis not present

## 2017-10-31 DIAGNOSIS — I10 Essential (primary) hypertension: Secondary | ICD-10-CM | POA: Diagnosis not present

## 2017-10-31 DIAGNOSIS — I452 Bifascicular block: Secondary | ICD-10-CM

## 2017-10-31 NOTE — Patient Instructions (Signed)

## 2018-11-09 ENCOUNTER — Other Ambulatory Visit: Payer: Self-pay

## 2018-11-09 ENCOUNTER — Encounter: Payer: Self-pay | Admitting: Cardiology

## 2018-11-09 ENCOUNTER — Ambulatory Visit (INDEPENDENT_AMBULATORY_CARE_PROVIDER_SITE_OTHER): Payer: Medicare HMO | Admitting: Cardiology

## 2018-11-09 VITALS — BP 110/68 | HR 48 | Temp 97.7°F | Ht 67.0 in | Wt 185.6 lb

## 2018-11-09 DIAGNOSIS — I10 Essential (primary) hypertension: Secondary | ICD-10-CM

## 2018-11-09 DIAGNOSIS — I739 Peripheral vascular disease, unspecified: Secondary | ICD-10-CM

## 2018-11-09 DIAGNOSIS — R001 Bradycardia, unspecified: Secondary | ICD-10-CM

## 2018-11-09 DIAGNOSIS — E785 Hyperlipidemia, unspecified: Secondary | ICD-10-CM | POA: Diagnosis not present

## 2018-11-09 DIAGNOSIS — I25119 Atherosclerotic heart disease of native coronary artery with unspecified angina pectoris: Secondary | ICD-10-CM

## 2018-11-09 HISTORY — DX: Bradycardia, unspecified: R00.1

## 2018-11-09 HISTORY — DX: Peripheral vascular disease, unspecified: I73.9

## 2018-11-09 NOTE — Patient Instructions (Addendum)
Medication Instructions:  Your physician recommends that you continue on your current medications as directed. Please refer to the Current Medication list given to you today.  If you need a refill on your cardiac medications before your next appointment, please call your pharmacy.   Lab work: None If you have labs (blood work) drawn today and your tests are completely normal, you will receive your results only by: Marland Kitchen. MyChart Message (if you have MyChart) OR . A paper copy in the mail If you have any lab test that is abnormal or we need to change your treatment, we will call you to review the results.  Testing/Procedures:Your physician has requested that you have an ankle brachial index (ABI). During this test an ultrasound and blood pressure cuff are used to evaluate the arteries that supply the arms and legs with blood. Allow thirty minutes for this exam. There are no restrictions or special instructions.  Ankle-Brachial Index Test Why am I having this test? The ankle-brachial index (ABI) test is used to diagnose peripheral vascular disease (PVD). PVD is also known as peripheral arterial disease (PAD). PVD is the blocking or hardening of the arteries anywhere within the circulatory system beyond the heart. PVD is caused by:  Cholesterol deposits in your blood vessels (atherosclerosis). This is the most common cause of this condition.  Irritation and swelling (inflammation) in the blood vessels.  Blood clots in the vessels. Cholesterol deposits cause arteries to narrow. Normal delivery of oxygen to your tissues is affected, causing muscle pain and fatigue. This is called claudication. PVD means that there may also be a buildup of cholesterol:  In your heart. This increases the risk of heart attacks.  In your brain. This increases the risk of strokes. What is being tested? The ankle-brachial index test measures the blood flow in your arms and legs. The blood flow will show if blood  vessels in your legs have been narrowed by cholesterol deposits. How do I prepare for this test?  Wear loose clothing.  Do not use any tobacco products, including cigarettes, chewing tobacco, or e-cigarettes, for at least 30 minutes before the test. What happens during the test?  1. You will lie down in a resting position. 2. Your health care provider will use a blood pressure machine and a small ultrasound device (Doppler) to measure the systolic pressures on your upper arms and ankles. Systolic pressure is the pressure inside your arteries when your heart pumps. 3. Systolic pressure measurements will be taken several times, and at several points, on both the ankle and the arm. 4. Your health care provider will divide the highest systolic pressure of the ankle by the highest systolic pressure of the arm. The result is the ankle-brachial pressure ratio, or ABI. Sometimes this test will be repeated after you have exercised on a treadmill for 5 minutes. You may have leg pain during the exercise portion of the test if you suffer from PVD. If the index number drops after exercise, this may show that PVD is present. How are the results reported? Your test results will be reported as a value that shows the ratio of your ankle pressure to your arm pressure (ABI ratio). Your health care provider will compare your results to normal ranges that were established after testing a large group of people (reference ranges). Reference ranges may vary among labs and hospitals. For this test, a common reference range is:  ABI ratio of 0.9 to 1.3. What do the results mean? An ABI  ratio that is below the reference range is considered abnormal and may indicate PVD in the legs. Talk with your health care provider about what your results mean. Questions to ask your health care provider Ask your health care provider, or the department that is doing the test:  When will my results be ready?  How will I get my  results?  What are my treatment options?  What other tests do I need?  What are my next steps? Summary  The ankle-brachial index (ABI) test is used to diagnose peripheral vascular disease (PVD). PVD is also known as peripheral arterial disease (PAD).  The ankle-brachial index test measures the blood flow in your arms and legs.  The highest systolic pressure of the ankle is divided by the highest systolic pressure of the arm. The result is the ABI ratio.  An ABI ratio that is below 0.9 is considered abnormal and may indicate PVD in the legs. This information is not intended to replace advice given to you by your health care provider. Make sure you discuss any questions you have with your health care provider. Document Released: 03/25/2004 Document Revised: 01/11/2018 Document Reviewed: 12/13/2016 Elsevier Patient Education  [place]: At [facility], you and your health needs are our priority.  As part of our continuing mission to provide you with exceptional heart care, we have created designated Provider Care Teams.  These Care Teams include your primary Cardiologist (physician) and Advanced Practice Providers (APPs -  Physician Assistants and Nurse Practitioners) who all work together to provide you with the care you need, when you need it. You will need a follow up appointment in 6 months.  Please call our office 2 months in advance to schedule this appointment.  You may see No primary care provider on file. or another member of our [employer] Provider Team in [place]: Jenne Campus, MD . Jyl Heinz, MD  Any Other Special Instructions Will Be Listed Below (If Applicable).

## 2018-11-09 NOTE — Progress Notes (Signed)
Cardiology Office Note:    Date:  11/09/2018   ID:  Anthony Mcgee, DOB 1945/09/01, MRN [id number]  PCP:  Gala Lewandowsky, MD  Cardiologist:  Shirlee More, MD    Referring MD: Gala Lewandowsky, MD    ASSESSMENT:    1. PAD (peripheral artery disease) ([place])   2. Coronary artery disease involving native coronary artery of native heart with angina pectoris ([place])   3. Essential hypertension   4. Dyslipidemia   5. Sinus bradycardia    PLAN:    In order of problems listed above:  1. He has claudication bilaterally in the calf and has vascular calcification on plain x-ray.  I do not think he has severe peripheral arterial disease and I do not think will interfere with his planned orthopedic surgery but he will have ABIs segmental pressures and PVR done to make the decision. 2. CAD is stable continue medical treatment I will think he requires an ischemia evaluation preoperatively New [place] Heart Association class I exercise tolerance exceeds 4 METS 3. Stable at target continue current treatment avoid rate slowing medication 4. Stable continue a statin 5. Stable asymptomatic   Next appointment: 6 months   Medication Adjustments/Labs and Tests Ordered: Current medicines are reviewed at length with the patient today.  Concerns regarding medicines are outlined above.  No orders of the defined types were placed in this encounter.  No orders of the defined types were placed in this encounter.   No chief complaint on file.   History of Present Illness:    Anthony Mcgee is a 73 y.o. male with a hx of  CAD, CABG 2000 RBBB and LAHB hypertension T2 DM and hyperlipidemia last seen 10/31/2017. Compliance with diet, lifestyle and medications: Yes  He is pending left total knee replacement from his description he had a plain x-ray done vascular calcification was told to see me regarding PAD and adequacy for wound healing.  He has severe pain in the left knee and also sounds like he has calf  claudication.  The symptoms are not severe or limiting he has no rest symptoms he has palpable pulses dorsalis pedis and posterior tibial bilaterally.  I will ask him to have ABI segmental pressures and PVR performed and unless he has significant PAD I would not interfere with this plan of total knee replacement.  His CAD is stable no anginal discomfort his exercise tolerance exceeds 4 METS.  I do not think he requires a preoperative ischemia evaluation.  His hypertension and dyslipidemia are stable.  He has resting sinus bradycardia 49 bpm and is on no rate slowing medications and is a symptomatic. Past Medical History:  Diagnosis Date  . Arthritis 09/16/2010  . Carpal tunnel syndrome 10/14/2010  . Coronary artery disease involving native coronary artery of native heart with angina pectoris ([place]) 06/16/2015   Overview:  Lexiscan MPS 08/18/13: normal perfusion, EF 63%  . Depression 09/16/2010  . Dyslipidemia 06/16/2015  . Essential hypertension 09/16/2010  . Headache 09/16/2010  . RBBB (right bundle branch block with left anterior fascicular block) 06/16/2015  . 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 Type 2 diabetes mellitus ([place]) 09/16/2010    Past Surgical History:  Procedure Laterality Date  . BACK SURGERY    . CARDIAC CATHETERIZATION    . CERVICAL SPINE SURGERY    . CORONARY ARTERY BYPASS GRAFT    . ELBOW SURGERY      Current Medications: Current Meds  Medication Sig  . allopurinol (  ZYLOPRIM) 300 MG tablet Take 300 mg by mouth.  Marland Kitchen. aspirin EC 81 MG tablet 81 mg.  . CINNAMON PO Take 1 capsule by mouth.  . gabapentin (NEURONTIN) 300 MG capsule 600 mg.  . metFORMIN (GLUCOPHAGE) 500 MG tablet Take 500 mg by mouth 2 (two) times daily with a meal.   . Omega-3 Fatty Acids (FISH OIL) 1000 MG CAPS Take 1 capsule by mouth daily.  Marland Kitchen. rOPINIRole (REQUIP) 1 MG tablet Take 1 mg by mouth at bedtime.   . simvastatin (ZOCOR) 20 MG tablet Take 20 mg by mouth daily at 6 PM.   .  tamsulosin (FLOMAX) 0.4 MG CAPS capsule Take 0.4 mg by mouth daily.   . traMADol (ULTRAM) 50 MG tablet TAKE 1 TO 2 TABLETS BY MOUTH TWICE DAILY AS NEEDED FOR SEVERE PAIN  . trandolapril (MAVIK) 4 MG tablet Take 4 mg by mouth daily.    Current Facility-Administered Medications for the 11/09/18 encounter (Office Visit) with Baldo DaubMunley, Brian J, MD  Medication  . triamcinolone acetonide (KENALOG) 10 MG/ML injection 10 mg  . triamcinolone acetonide (KENALOG-40) injection 20 mg     Allergies:   Codeine   Social History   Socioeconomic History  . Marital status: Married    Spouse name: Not on file  . Number of children: Not on file  . Years of education: Not on file  . Highest education level: Not on file  Occupational History  . Not on file  Social Needs  . Financial resource strain: Not on file  . Food insecurity    Worry: Not on file    Inability: Not on file  . Transportation needs    Medical: Not on file    Non-medical: Not on file  Tobacco Use  . Smoking status: Former Games developermoker  . Smokeless tobacco: Never Used  Substance and Sexual Activity  . Alcohol use: Not Currently  . Drug use: Not Currently  . Sexual activity: Not on file  Lifestyle  . Physical activity    Days per week: Not on file    Minutes per session: Not on file  . Stress: Not on file  Relationships  . Social Musicianconnections    Talks on phone: Not on file    Gets together: Not on file    Attends religious service: Not on file    Active member of club or organization: Not on file    Attends meetings of clubs or organizations: Not on file    Relationship status: Not on file  Other Topics Concern  . Not on file  Social History Narrative  . Not on file     Family History: The patient's family history includes Heart attack in his father; Heart failure in his father and mother; Hyperlipidemia in his father and mother; Hypertension in his father and mother. ROS:   Please see the history of present illness.    All  other systems reviewed and are negative.  EKGs/Labs/Other Studies Reviewed:    The following studies were reviewed today:  EKG:  EKG ordered today and personally reviewed.  The ekg ordered today demonstrates sinus bradycardia 49 bpm otherwise normal  Recent Labs: No results found for requested labs within last 8760 hours.  Recent Lipid Panel No results found for: CHOL, TRIG, HDL, CHOLHDL, VLDL, LDLCALC, LDLDIRECT  Physical Exam:    VS:  BP 110/68   Pulse (!) 48   Temp 97.7 F (36.5 C)   Ht 5\' 7"  (1.702 m)   Wt  185 lb 9.6 oz (84.2 kg)   SpO2 97%   BMI 29.07 kg/m     Wt Readings from Last 3 Encounters:  11/09/18 185 lb 9.6 oz (84.2 kg)  10/31/17 184 lb (83.5 kg)  12/23/15 185 lb (83.9 kg)     GEN:  Well nourished, well developed in no acute distress HEENT: Normal NECK: No JVD; No carotid bruits LYMPHATICS: No lymphadenopathy CARDIAC: RRR, no murmurs, rubs, gallops RESPIRATORY:  Clear to auscultation without rales, wheezing or rhonchi  ABDOMEN: Soft, non-tender, non-distended MUSCULOSKELETAL:  No edema; No deformity  SKIN: Warm and dry NEUROLOGIC:  Alert and oriented x 3 PSYCHIATRIC:  Normal affect  He has a lack of hair on both lower extremities the skin is warm bilaterally no ischemic changes motor and sensory function is intact and he has palpable DP and PT pulses bilaterally   Signed, Norman HerrlichBrian Munley, MD  11/09/2018 3:45 PM    [facility]

## 2018-11-27 ENCOUNTER — Ambulatory Visit (INDEPENDENT_AMBULATORY_CARE_PROVIDER_SITE_OTHER): Payer: Medicare HMO

## 2018-11-27 ENCOUNTER — Other Ambulatory Visit: Payer: Self-pay

## 2018-11-27 DIAGNOSIS — I739 Peripheral vascular disease, unspecified: Secondary | ICD-10-CM

## 2018-11-27 NOTE — Progress Notes (Signed)
Vascular ultrasound ABI exam has been performed.   Jimmy Garmon Dehn RDCS, RVT

## 2019-01-07 DIAGNOSIS — Z96652 Presence of left artificial knee joint: Secondary | ICD-10-CM

## 2019-01-08 ENCOUNTER — Ambulatory Visit: Payer: Medicare Other | Admitting: Cardiology

## 2019-01-08 DIAGNOSIS — R300 Dysuria: Secondary | ICD-10-CM

## 2019-01-08 DIAGNOSIS — Z951 Presence of aortocoronary bypass graft: Secondary | ICD-10-CM | POA: Insufficient documentation

## 2019-01-08 DIAGNOSIS — N400 Enlarged prostate without lower urinary tract symptoms: Secondary | ICD-10-CM

## 2019-01-08 DIAGNOSIS — R339 Retention of urine, unspecified: Secondary | ICD-10-CM

## 2019-07-29 DIAGNOSIS — B999 Unspecified infectious disease: Secondary | ICD-10-CM | POA: Insufficient documentation

## 2019-07-29 DIAGNOSIS — R509 Fever, unspecified: Secondary | ICD-10-CM | POA: Insufficient documentation

## 2019-10-28 DIAGNOSIS — D72829 Elevated white blood cell count, unspecified: Secondary | ICD-10-CM | POA: Insufficient documentation

## 2019-10-28 DIAGNOSIS — R52 Pain, unspecified: Secondary | ICD-10-CM

## 2020-05-13 ENCOUNTER — Other Ambulatory Visit: Payer: Self-pay

## 2020-05-13 ENCOUNTER — Encounter: Payer: Self-pay | Admitting: Cardiology

## 2020-05-13 ENCOUNTER — Ambulatory Visit: Payer: Medicare HMO | Admitting: Cardiology

## 2020-05-13 VITALS — BP 112/60 | HR 56 | Ht 67.0 in | Wt 180.0 lb

## 2020-05-13 DIAGNOSIS — I25119 Atherosclerotic heart disease of native coronary artery with unspecified angina pectoris: Secondary | ICD-10-CM

## 2020-05-13 DIAGNOSIS — E785 Hyperlipidemia, unspecified: Secondary | ICD-10-CM | POA: Diagnosis not present

## 2020-05-13 DIAGNOSIS — I10 Essential (primary) hypertension: Secondary | ICD-10-CM

## 2020-05-13 DIAGNOSIS — R001 Bradycardia, unspecified: Secondary | ICD-10-CM | POA: Diagnosis not present

## 2020-05-13 NOTE — Progress Notes (Signed)
Cardiology Office Note:    Date:  05/13/2020   ID:  LUISMANUEL Mcgee, DOB [birth year], MRN [id number]  PCP:  Maris Berger, MD  Cardiologist:  Norman Herrlich, MD    Referring MD: Maris Berger, MD    ASSESSMENT:    1. Coronary artery disease involving native coronary artery of native heart with angina pectoris (HCC)   2. Essential hypertension   3. Dyslipidemia   4. Sinus bradycardia    PLAN:    In order of problems listed above:  1. Overall Anthony Mcgee is doing well stable CAD remote from bypass surgery no continue aspirin and statin and obtain labs from his PCP office at this time I would not advise an ischemia evaluation 2. BP at target presently not on antihypertensive agents 3. Stable dyslipidemia continue his intermediate intensity statin obtain labs from his PCP office 4. Stable not on rate slowing medications and EKG shows a stable pattern left anterior hemiblock   Next appointment: 1 year   Medication Adjustments/Labs and Tests Ordered: Current medicines are reviewed at length with the patient today.  Concerns regarding medicines are outlined above.  No orders of the defined types were placed in this encounter.  No orders of the defined types were placed in this encounter.   No chief complaint on file.   History of Present Illness:    Anthony Mcgee is a 75 y.o. 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 Compliance with diet, lifestyle and medications: Yes  Unfortunately multiple family members have died of Covid on vaccinated.  He is pending his booster.  He continues to have trouble in his legs false claudication from disc disease and is due for another epidural injection.  A year ago he had normal ABIs and PVRs and segmental pressures.  He has no shortness of  breath angina palpitation or syncope.  Recent labs of his PCP he reported his lipids at target A1c 7.2% he tolerates statin without muscle pain or weakness Past Medical History:  Diagnosis Date  . Arthritis 09/16/2010  . Carpal tunnel syndrome 10/14/2010  . Coronary artery disease involving native coronary artery of native heart with angina pectoris (HCC) 06/16/2015   Overview:  Lexiscan MPS 08/18/13: normal perfusion, EF 63%  . Depression 09/16/2010  . Dyslipidemia 06/16/2015  . Essential hypertension 09/16/2010  . Headache 09/16/2010  . RBBB (right bundle branch block with left anterior fascicular block) 06/16/2015  . 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 Type 2 diabetes mellitus (HCC) 09/16/2010    Past Surgical History:  Procedure Laterality Date  . BACK SURGERY    . CARDIAC CATHETERIZATION    . CERVICAL SPINE SURGERY    . CORONARY ARTERY BYPASS GRAFT    . ELBOW SURGERY      Current Medications: Current Meds  Medication Sig  . allopurinol (ZYLOPRIM) 300 MG tablet Take 300 mg by mouth.  Marland Kitchen aspirin EC 81 MG tablet 81 mg.  . CINNAMON PO Take 1 capsule by mouth.  . Cyanocobalamin (VITAMIN B-12 PO) Take 1 tablet by mouth daily.  . Gabapentin, Once-Daily, (GRALISE) 300 MG TABS Take 2 tablets by mouth in the morning and at bedtime.  . metFORMIN (GLUCOPHAGE) 500 MG tablet Take 500 mg by mouth 2 (two) times daily with a meal.   . montelukast (  SINGULAIR) 10 MG tablet Take 10 mg by mouth daily.  . Omega-3 Fatty Acids (FISH OIL) 1000 MG CAPS Take 1 capsule by mouth daily.  Marland Kitchen rOPINIRole (REQUIP) 1 MG tablet Take 1 mg by mouth at bedtime.   . simvastatin (ZOCOR) 20 MG tablet Take 20 mg by mouth daily at 6 PM.   . tamsulosin (FLOMAX) 0.4 MG CAPS capsule Take 0.4 mg by mouth daily.   . traMADol (ULTRAM) 50 MG tablet TAKE 1 TO 2 TABLETS BY MOUTH TWICE DAILY AS NEEDED FOR SEVERE PAIN  . trandolapril (MAVIK) 4 MG tablet Take 4 mg by mouth daily.   . Vitamin D, Ergocalciferol,  50 MCG (2000 UT) CAPS Take 1 tablet by mouth daily.   Current Facility-Administered Medications for the 05/13/20 encounter (Office Visit) with Baldo Daub, MD  Medication  . triamcinolone acetonide (KENALOG) 10 MG/ML injection 10 mg  . triamcinolone acetonide (KENALOG-40) injection 20 mg     Allergies:   Codeine   Social History   Socioeconomic History  . Marital status: Married    Spouse name: Not on file  . Number of children: Not on file  . Years of education: Not on file  . Highest education level: Not on file  Occupational History  . Not on file  Tobacco Use  . Smoking status: Former Games developer  . Smokeless tobacco: Never Used  Vaping Use  . Vaping Use: Never used  Substance and Sexual Activity  . Alcohol use: Not Currently  . Drug use: Not Currently  . Sexual activity: Not on file  Other Topics Concern  . Not on file  Social History Narrative  . Not on file   Social Determinants of Health   Financial Resource Strain: Not on file  Food Insecurity: Not on file  Transportation Needs: Not on file  Physical Activity: Not on file  Stress: Not on file  Social Connections: Not on file     Family History: The patient's family history includes Heart attack in his father; Heart failure in his father and mother; Hyperlipidemia in his father and mother; Hypertension in his father and mother. ROS:   Please see the history of present illness.    All other systems reviewed and are negative.  EKGs/Labs/Other Studies Reviewed:    The following studies were reviewed today:  EKG:  EKG ordered today and personally reviewed.  The ekg ordered today demonstrates sinus rhythm normal PR interval left anterior hemiblock nonspecific ST atrial premature beats  Recent Labs: Requested from his PCP  Physical Exam:    VS:  BP 112/60   Pulse (!) 56   Ht 5\' 7"  (1.702 m)   Wt 180 lb (81.6 kg)   SpO2 95%   BMI 28.19 kg/m     Wt Readings from Last 3 Encounters:  05/13/20 180 lb  (81.6 kg)  11/09/18 185 lb 9.6 oz (84.2 kg)  10/31/17 184 lb (83.5 kg)     GEN:  Well nourished, well developed in no acute distress HEENT: Normal NECK: No JVD; No carotid bruits LYMPHATICS: No lymphadenopathy CARDIAC: RRR, no murmurs, rubs, gallops RESPIRATORY:  Clear to auscultation without rales, wheezing or rhonchi  ABDOMEN: Soft, non-tender, non-distended MUSCULOSKELETAL:  No edema; No deformity  SKIN: Warm and dry NEUROLOGIC:  Alert and oriented x 3 PSYCHIATRIC:  Normal affect    Signed, 11/02/17, MD  05/13/2020 1:26 PM    [facility]

## 2020-05-13 NOTE — Patient Instructions (Signed)

## 2020-08-26 DIAGNOSIS — E785 Hyperlipidemia, unspecified: Secondary | ICD-10-CM

## 2020-08-26 DIAGNOSIS — Z8739 Personal history of other diseases of the musculoskeletal system and connective tissue: Secondary | ICD-10-CM

## 2020-08-26 DIAGNOSIS — M549 Dorsalgia, unspecified: Secondary | ICD-10-CM | POA: Insufficient documentation

## 2020-08-26 DIAGNOSIS — G2581 Restless legs syndrome: Secondary | ICD-10-CM | POA: Insufficient documentation

## 2020-08-28 ENCOUNTER — Other Ambulatory Visit: Payer: Self-pay | Admitting: Surgery

## 2020-08-28 DIAGNOSIS — M4726 Other spondylosis with radiculopathy, lumbar region: Secondary | ICD-10-CM

## 2020-09-03 ENCOUNTER — Other Ambulatory Visit: Payer: Self-pay

## 2020-09-03 ENCOUNTER — Ambulatory Visit
Admission: RE | Admit: 2020-09-03 | Discharge: 2020-09-03 | Disposition: A | Discharge status: Home / Self Care | Payer: Medicare HMO | Source: Ambulatory Visit | Attending: Surgery | Admitting: Surgery

## 2020-09-03 DIAGNOSIS — M4726 Other spondylosis with radiculopathy, lumbar region: Secondary | ICD-10-CM

## 2020-09-03 MED ORDER — IOPAMIDOL (ISOVUE-M 200) INJECTION 41%
1.0000 mL | Freq: Once | INTRAMUSCULAR | Status: AC
  Administered 2020-09-03: 1 mL via EPIDURAL

## 2020-09-03 MED ORDER — METHYLPREDNISOLONE ACETATE 40 MG/ML INJ SUSP (RADIOLOG
80.0000 mg | Freq: Once | INTRAMUSCULAR | Status: AC
  Administered 2020-09-03: 80 mg via EPIDURAL

## 2020-09-03 NOTE — Discharge Instructions (Signed)
Post Procedure Spinal Discharge Instruction Sheet  1. You may resume a regular diet and any medications that you routinely take (including pain medications).  2. No driving day of procedure.  3. Light activity throughout the rest of the day.  Do not do any strenuous work, exercise, bending or lifting.  The day following the procedure, you can resume normal physical activity but you should refrain from exercising or physical therapy for at least three days thereafter.   Common Side Effects:   Headaches- take your usual medications as directed by your physician.  Increase your fluid intake.  Caffeinated beverages may be helpful.  Lie flat in bed until your headache resolves.   Restlessness or inability to sleep- you may have trouble sleeping for the next few days.  Ask your referring physician if you need any medication for sleep.   Facial flushing or redness- should subside within a few days.   Increased pain- a temporary increase in pain a day or two following your procedure is not unusual.  Take your pain medication as prescribed by your referring physician.   Leg cramps  Please contact our office at [phone] for the following symptoms:  Fever greater than 100 degrees.  Headaches unresolved with medication after 2-3 days.  Increased swelling, pain, or redness at injection site.   Thank you for visiting [facility] today.   YOU MAY RESUME YOUR ASPRIN ANYTIME AFTER YOUR INJECTION TODAY 09/03/20

## 2020-11-25 DIAGNOSIS — M47816 Spondylosis without myelopathy or radiculopathy, lumbar region: Secondary | ICD-10-CM | POA: Insufficient documentation

## 2021-05-26 DIAGNOSIS — M961 Postlaminectomy syndrome, not elsewhere classified: Secondary | ICD-10-CM

## 2021-07-14 ENCOUNTER — Ambulatory Visit: Payer: Medicare HMO | Admitting: Cardiology

## 2021-07-14 ENCOUNTER — Encounter: Payer: Self-pay | Admitting: Cardiology

## 2021-07-14 VITALS — BP 128/64 | HR 61 | Ht 67.0 in | Wt 189.0 lb

## 2021-07-14 DIAGNOSIS — I10 Essential (primary) hypertension: Secondary | ICD-10-CM

## 2021-07-14 DIAGNOSIS — E785 Hyperlipidemia, unspecified: Secondary | ICD-10-CM

## 2021-07-14 DIAGNOSIS — I25119 Atherosclerotic heart disease of native coronary artery with unspecified angina pectoris: Secondary | ICD-10-CM

## 2021-07-14 DIAGNOSIS — I452 Bifascicular block: Secondary | ICD-10-CM

## 2021-07-14 DIAGNOSIS — Z951 Presence of aortocoronary bypass graft: Secondary | ICD-10-CM

## 2021-07-14 NOTE — Progress Notes (Signed)
?Cardiology Office Note:   ? ?Date:  07/14/2021  ? ?ID:  JABES BROTHERSON, DOB [birth year], MRN [id number] ? ?PCP:  Gala Lewandowsky, MD  ?Cardiologist:  Shirlee More, MD   ? ?Referring MD: Gala Lewandowsky, MD  ? ? ?ASSESSMENT:   ? ?1. Coronary artery disease involving native coronary artery of native heart with angina pectoris ([place])   ?2. S/P CABG (coronary artery bypass graft)   ?3. RBBB (right bundle branch block with left anterior fascicular block)   ?4. Essential hypertension   ?5. Dyslipidemia   ? ?PLAN:   ? ?In order of problems listed above: ? ?Trevoris continues to do well with CAD he remains asymptomatic no angina following CABG on current medical treatment continue aspirin and fish oil along with simvastatin.  Lipids are followed through his PCP office ?Stable EKG pattern today's tracing ?BP at target continue current treatment ?Lipids stable continue with statin ? ? ?Next appointment: 1 year ? ? ?Medication Adjustments/Labs and Tests Ordered: ?Current medicines are reviewed at length with the patient today.  Concerns regarding medicines are outlined above.  ?Orders Placed This Encounter  ?Procedures  ? EKG 12-Lead  ? ?No orders of the defined types were placed in this encounter. ? ? ?Chief Complaint  ?Patient presents with  ? Coronary Artery Disease  ? ? ?History of Present Illness:   ? ?Anthony Mcgee is a 76 y.o. 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 ? ?Compliance with diet, lifestyle and medications: Yes ? ?Keyontay is doing well from a cardiology perspective he is limited by hip and lower extremity pain and is pending a spinal cord stimulator he has no chest pain edema shortness of breath palpitation or syncope. ? ?He tolerates his statin without muscle pain or weakness ?He has had recent labs done at his primary care physician office he was told that  his lipids and A1c are at target. ?Past Medical History:  ?Diagnosis Date  ? Arthritis 09/16/2010  ? Carpal tunnel syndrome 10/14/2010  ? Coronary artery disease involving native coronary artery of native heart with angina pectoris ([place]) 06/16/2015  ? Overview:  Lexiscan MPS 08/18/13: normal perfusion, EF 63%  ? Depression 09/16/2010  ? Dyslipidemia 06/16/2015  ? Essential hypertension 09/16/2010  ? Headache 09/16/2010  ? RBBB (right bundle branch block with left anterior fascicular block) 06/16/2015  ? Thoracic or lumbosacral neuritis or radiculitis 09/20/2010  ? Type 2 diabetes mellitus ([place]) 09/16/2010  ? ? ?Past Surgical History:  ?Procedure Laterality Date  ? BACK SURGERY    ? CARDIAC CATHETERIZATION    ? CERVICAL SPINE SURGERY    ? CORONARY ARTERY BYPASS GRAFT    ? ELBOW SURGERY    ? ? ?Current Medications: ?Current Meds  ?Medication Sig  ? allopurinol (ZYLOPRIM) 300 MG tablet Take 300 mg by mouth.  ? aspirin EC 81 MG tablet Take 81 mg by mouth daily.  ? CINNAMON PO Take 1 capsule by mouth.  ? Cyanocobalamin (VITAMIN B-12 PO) Take 1 tablet by mouth daily.  ? metFORMIN (GLUCOPHAGE) 500 MG tablet Take 500 mg by mouth 2 (two) times daily with a meal.   ? montelukast (SINGULAIR) 10 MG tablet Take 10 mg by mouth daily.  ? Omega-3 Fatty Acids (FISH OIL) 1000 MG CAPS Take 1 capsule by mouth daily.  ? pregabalin (LYRICA) 150 MG capsule Take 150  mg by mouth 2 (two) times daily.  ? rOPINIRole (REQUIP) 1 MG tablet Take 1 mg by mouth at bedtime.   ? simvastatin (ZOCOR) 20 MG tablet Take 20 mg by mouth daily at 6 PM.   ? tamsulosin (FLOMAX) 0.4 MG CAPS capsule Take 0.4 mg by mouth daily.   ? traMADol (ULTRAM) 50 MG tablet TAKE 1 TO 2 TABLETS BY MOUTH TWICE DAILY AS NEEDED FOR SEVERE PAIN  ? trandolapril (MAVIK) 4 MG tablet Take 4 mg by mouth daily.   ? Vitamin D, Ergocalciferol, 50 MCG (2000 UT) CAPS Take 1 tablet by mouth daily.  ? ?Current Facility-Administered Medications for the 07/14/21 encounter (Office Visit) with Richardo Priest, MD  ?Medication  ? triamcinolone acetonide (KENALOG) 10 MG/ML injection 10 mg  ? triamcinolone acetonide (KENALOG-40) injection 20 mg  ?  ? ?Allergies:   Codeine and Strawberry extract  ? ?Social History  ? ?Socioeconomic History  ? Marital status: Married  ?  Spouse name: Not on file  ? Number of children: Not on file  ? Years of education: Not on file  ? Highest education level: Not on file  ?Occupational History  ? Not on file  ?Tobacco Use  ? Smoking status: Former  ?  Passive exposure: Past  ? Smokeless tobacco: Never  ?Vaping Use  ? Vaping Use: Never used  ?Substance and Sexual Activity  ? Alcohol use: Not Currently  ? Drug use: Not Currently  ? Sexual activity: Not on file  ?Other Topics Concern  ? Not on file  ?Social History Narrative  ? Not on file  ? ?Social Determinants of Health  ? ?Financial Resource Strain: Not on file  ?Food Insecurity: Not on file  ?Transportation Needs: Not on file  ?Physical Activity: Not on file  ?Stress: Not on file  ?Social Connections: Not on file  ?  ? ?Family History: ?The patient's family history includes Heart attack in his father; Heart failure in his father and mother; Hyperlipidemia in his father and mother; Hypertension in his father and mother. ?ROS:   ?Please see the history of present illness.    ?All other systems reviewed and are negative. ? ?EKGs/Labs/Other Studies Reviewed:   ? ?The following studies were reviewed today: ? ?EKG:  EKG ordered today and personally reviewed.  The ekg ordered today demonstrates sinus rhythm with no acute ischemic changes also right bundle branch block ? ?Recent Labs: ?08/27/2020 glucose 269 creatinine 1.03 potassium 4.5 ?Hemoglobin 13.3 ? ?Physical Exam:   ? ?VS:  BP 128/64 (BP Location: Right Arm)   Pulse 61   Ht 5\' 7"  (1.702 m)   Wt 189 lb (85.7 kg)   SpO2 94%   BMI 29.60 kg/m?    ? ?Wt Readings from Last 3 Encounters:  ?07/14/21 189 lb (85.7 kg)  ?05/13/20 180 lb (81.6 kg)  ?11/09/18 185 lb 9.6 oz (84.2 kg)  ?   ? ?GEN:  Well nourished, well developed in no acute distress ?HEENT: Normal ?NECK: No JVD; No carotid bruits ?LYMPHATICS: No lymphadenopathy ?CARDIAC:RRR, no murmurs, rubs, gallops ?RESPIRATORY:  Clear to auscultation without rales, wheezing or rhonchi  ?ABDOMEN: Soft, non-tender, non-distended ?MUSCULOSKELETAL:  No edema; No deformity  ?SKIN: Warm and dry ?NEUROLOGIC:  Alert and oriented x 3 ?PSYCHIATRIC:  Normal affect  ? ? ?Signed, ?Shirlee More, MD  ?07/14/2021 4:17 PM    ?[place]  ?

## 2021-07-14 NOTE — Patient Instructions (Signed)

## 2022-03-25 DIAGNOSIS — S22080A Wedge compression fracture of T11-T12 vertebra, initial encounter for closed fracture: Secondary | ICD-10-CM

## 2022-03-25 DIAGNOSIS — S22009A Unspecified fracture of unspecified thoracic vertebra, initial encounter for closed fracture: Secondary | ICD-10-CM

## 2022-05-02 DIAGNOSIS — M7062 Trochanteric bursitis, left hip: Secondary | ICD-10-CM | POA: Insufficient documentation

## 2022-07-21 ENCOUNTER — Telehealth: Payer: Self-pay | Admitting: Cardiology

## 2022-07-21 ENCOUNTER — Emergency Department (HOSPITAL_BASED_OUTPATIENT_CLINIC_OR_DEPARTMENT_OTHER)
Admission: EM | Admit: 2022-07-21 | Discharge: 2022-07-21 | Disposition: A | Discharge status: Home / Self Care | Payer: Medicare HMO | Attending: Emergency Medicine | Admitting: Emergency Medicine

## 2022-07-21 ENCOUNTER — Encounter (HOSPITAL_BASED_OUTPATIENT_CLINIC_OR_DEPARTMENT_OTHER): Payer: Self-pay | Admitting: Urology

## 2022-07-21 ENCOUNTER — Emergency Department (HOSPITAL_BASED_OUTPATIENT_CLINIC_OR_DEPARTMENT_OTHER): Payer: Medicare HMO

## 2022-07-21 DIAGNOSIS — Z7982 Long term (current) use of aspirin: Secondary | ICD-10-CM | POA: Diagnosis not present

## 2022-07-21 DIAGNOSIS — Z951 Presence of aortocoronary bypass graft: Secondary | ICD-10-CM

## 2022-07-21 DIAGNOSIS — R079 Chest pain, unspecified: Secondary | ICD-10-CM

## 2022-07-21 DIAGNOSIS — R0789 Other chest pain: Secondary | ICD-10-CM | POA: Insufficient documentation

## 2022-07-21 DIAGNOSIS — R001 Bradycardia, unspecified: Secondary | ICD-10-CM | POA: Diagnosis not present

## 2022-07-21 DIAGNOSIS — Z79899 Other long term (current) drug therapy: Secondary | ICD-10-CM | POA: Insufficient documentation

## 2022-07-21 DIAGNOSIS — I10 Essential (primary) hypertension: Secondary | ICD-10-CM | POA: Diagnosis not present

## 2022-07-21 DIAGNOSIS — E119 Type 2 diabetes mellitus without complications: Secondary | ICD-10-CM | POA: Diagnosis not present

## 2022-07-21 DIAGNOSIS — Z7984 Long term (current) use of oral hypoglycemic drugs: Secondary | ICD-10-CM | POA: Diagnosis not present

## 2022-07-21 DIAGNOSIS — I25119 Atherosclerotic heart disease of native coronary artery with unspecified angina pectoris: Secondary | ICD-10-CM

## 2022-07-21 LAB — BASIC METABOLIC PANEL
Anion gap: 8 (ref 5–15)
BUN: 16 mg/dL (ref 8–23)
CO2: 24 mmol/L (ref 22–32)
Calcium: 8.9 mg/dL (ref 8.9–10.3)
Chloride: 106 mmol/L (ref 98–111)
Creatinine, Ser: 1.12 mg/dL (ref 0.61–1.24)
GFR, Estimated: >60 mL/min (ref >60)
Glucose, Bld: 96 mg/dL (ref 70–99)
Potassium: 4.7 mmol/L (ref 3.5–5.1)
Sodium: 138 mmol/L (ref 135–145)

## 2022-07-21 LAB — CBC
HCT: 42.1 % (ref 39.0–52.0)
Hemoglobin: 13.7 g/dL (ref 13.0–17.0)
MCH: 29.8 pg (ref 26.0–34.0)
MCHC: 32.5 g/dL (ref 30.0–36.0)
MCV: 91.5 fL (ref 80.0–100.0)
Platelets: 165 10*3/uL (ref 150–400)
RBC: 4.6 MIL/uL (ref 4.22–5.81)
RDW: 14.1 % (ref 11.5–15.5)
WBC: 7.9 10*3/uL (ref 4.0–10.5)
nRBC: 0 % (ref 0.0–0.2)

## 2022-07-21 LAB — TROPONIN I (HIGH SENSITIVITY): Troponin I (High Sensitivity): 4 ng/L (ref <18)

## 2022-07-21 MED ORDER — ALUM & MAG HYDROXIDE-SIMETH 200-200-20 MG/5ML PO SUSP
30.0000 mL | Freq: Once | ORAL | Status: AC
  Administered 2022-07-21: 30 mL via ORAL
  Filled 2022-07-21: qty 30

## 2022-07-21 MED ORDER — FAMOTIDINE 20 MG PO TABS: 20.0000 mg | ORAL_TABLET | Freq: Two times a day (BID) | ORAL | 1 refills | Status: DC

## 2022-07-21 MED ORDER — NITROGLYCERIN 0.4 MG SL SUBL: 0.4000 mg | SUBLINGUAL_TABLET | SUBLINGUAL | 6 refills | Status: DC | PRN

## 2022-07-21 MED ORDER — FAMOTIDINE 20 MG PO TABS
40.0000 mg | ORAL_TABLET | Freq: Once | ORAL | Status: AC
  Administered 2022-07-21: 40 mg via ORAL
  Filled 2022-07-21: qty 2

## 2022-07-21 NOTE — ED Notes (Signed)
Attempt at IV to LAC, bruising from previous blood draw noted, IV infiltrated.  No labs drawn

## 2022-07-21 NOTE — Discharge Instructions (Signed)
Please follow-up with a cardiologist next week for your slow heart rate and your chest pain.

## 2022-07-21 NOTE — ED Triage Notes (Signed)
Pt states he has had some tightness in his chest that started 2 weeks ago, reports burning sensation  States was seen at [facility] 3 weeks ago with HR of 38  Was told by pcp to come to ER for eval   H/o CABG in 2008

## 2022-07-21 NOTE — Telephone Encounter (Signed)
Spoke with pt who states that his heart rate has been low and he request an appointment with Dr. Dulce Sellar. Appointment made as requested.   Pt also reports chest tightness/ pressure/ burning sensation left side of chest. Denies nausea/vomiting or shortness of breath. Advised to go to the ED for evaluation of same and low heart rate. Pt verbalized understanding and had no additional questions.  Will send in RX for NTG as pt does not have RX.

## 2022-07-21 NOTE — ED Provider Notes (Signed)
[place] [facility] Provider Note   CSN: [id number] Arrival date & time: 07/21/22  1203     History  Chief Complaint  Patient presents with   Chest Pain    Anthony Mcgee is a 77 y.o. 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  The patient reports that this discomfort is been on and off for "few months".  He says that he will have at rest and sometimes with exertion and a feeling of pressure on the left side of his chest that typically goes away.  He had the same feeling earlier this morning that is now gone away.  He reports he has a burning sensation in his mid chest.  He states he went to the [place] for checkup and was told that he has "a slow heart rate in the 30s" and advised to follow-up with a cardiologist.  He does see a cardiologist, Dr. Dulce Sellar, and has an office appointment scheduled for this coming Tuesday, in 4 days.  In the meantime the patient reports he is chronically been on aspirin.  He does not take second nitroglycerin because it causes a "horrible headache".  He denies any lightheadedness, vomiting, diaphoresis to me.  Patient specifically denies episodes of lightheadedness or syncope, no difficulty with activities, no limitations of physical activity.  He says he did not come to the ED because of the chest discomfort, but because he was told "my heart rate might be too slow".  Of note, he is got a spinal cord stimulator is currently disabled.  HPI     Home Medications Prior to Admission medications   Medication Sig Start Date End Date Taking? Authorizing Provider  famotidine (PEPCID) 20 MG tablet Take 1 tablet (20 mg total) by mouth 2 (two) times daily. 07/21/22 08/20/22 Yes Jackelynn Hosie, Kermit Balo, MD  allopurinol (ZYLOPRIM) 300 MG tablet Take 300 mg by mouth.    [provider]  aspirin EC 81 MG tablet Take 81 mg by mouth  daily.    [provider]  CINNAMON PO Take 1 capsule by mouth.    [provider]  Cyanocobalamin (VITAMIN B-12 PO) Take 1 tablet by mouth daily.    [provider]  metFORMIN (GLUCOPHAGE) 500 MG tablet Take 500 mg by mouth 2 (two) times daily with a meal.     [provider]  montelukast (SINGULAIR) 10 MG tablet Take 10 mg by mouth daily. 08/23/19   [provider]  nitroGLYCERIN (NITROSTAT) 0.4 MG SL tablet Place 1 tablet (0.4 mg total) under the tongue every 5 (five) minutes as needed. 07/21/22 10/19/22  Baldo Daub, MD  Omega-3 Fatty Acids (FISH OIL) 1000 MG CAPS Take 1 capsule by mouth daily.    [provider]  pregabalin (LYRICA) 150 MG capsule Take 150 mg by mouth 2 (two) times daily. 07/01/21   [provider]  rOPINIRole (REQUIP) 1 MG tablet Take 1 mg by mouth at bedtime.  09/20/10   [provider]  simvastatin (ZOCOR) 20 MG tablet Take 20 mg by mouth daily at 6 PM.     [provider]  tamsulosin (FLOMAX) 0.4 MG CAPS capsule Take 0.4 mg by mouth daily.     [provider]  traMADol (ULTRAM) 50 MG tablet TAKE 1 TO 2 TABLETS BY MOUTH TWICE DAILY AS NEEDED FOR SEVERE PAIN 12/14/15   [provider]  trandolapril (MAVIK) 4 MG tablet Take 4 mg by mouth daily.     [provider]  Vitamin D, Ergocalciferol, 50 MCG (2000 UT) CAPS Take 1 tablet by mouth daily.    [provider]      Allergies    Codeine and Strawberry extract    Review of Systems   Review of Systems  Physical Exam Updated Vital Signs BP (!) 145/63   Pulse (!) 46   Temp 97.8 F (36.6 C) (Oral)   Resp 14   Ht 5\' 7"  (1.702 m)   Wt 85.7 kg   SpO2 98%   BMI 29.59 kg/m  Physical Exam Constitutional:      General: He is not in acute distress. HENT:     Head: Normocephalic and atraumatic.  Eyes:     Conjunctiva/sclera: Conjunctivae normal.     Pupils: Pupils are equal, round, and reactive to light.   Cardiovascular:     Rate and Rhythm: Normal rate and regular rhythm.  Pulmonary:     Effort: Pulmonary effort is normal. No respiratory distress.  Abdominal:     General: There is no distension.     Tenderness: There is no abdominal tenderness.  Skin:    General: Skin is warm and dry.  Neurological:     General: No focal deficit present.     Mental Status: He is alert. Mental status is at baseline.  Psychiatric:        Mood and Affect: Mood normal.        Behavior: Behavior normal.     ED Results / Procedures / Treatments   Labs (all labs ordered are listed, but only abnormal results are displayed) Labs Reviewed  BASIC METABOLIC PANEL  CBC  TROPONIN I (HIGH SENSITIVITY)    EKG EKG Interpretation  Date/Time:  Thursday July 21 2022 12:17:52 EDT Ventricular Rate:  54 PR Interval:  168 QRS Duration: 166 QT Interval:  484 QTC Calculation: 459 R Axis:   -60 Text Interpretation: Sinus rhythm Atrial premature complex RBBB and LAFB Probable left ventricular hypertrophy No sig change from prior ecg Aug 2012 Confirmed by Alvester Chou [phone]) on 07/21/2022 12:29:06 PM  Radiology DG Chest 2 View  Result Date: 07/21/2022 CLINICAL DATA:  Chest tightness for a couple months.  Weakness. EXAM: CHEST - 2 VIEW COMPARISON:  Radiographs 11/25/2015.  CT 05/16/2013. FINDINGS: The heart size and mediastinal contours are stable status post median sternotomy and CABG. There is aortic atherosclerosis. Chronic obstructive pulmonary disease with stable mild scarring at both lung bases. No edema, confluent airspace opacity, pleural effusion or pneumothorax. No acute osseous findings are evident. There is a thoracic spinal stimulator, and the patient has undergone previous spinal augmentation at T12 and multilevel cervical fusion. IMPRESSION: Chronic obstructive pulmonary disease. No acute cardiopulmonary process. Electronically Signed   By: Carey Bullocks M.D.   On: 07/21/2022 13:20     Procedures Procedures    Medications Ordered in ED Medications  alum & mag hydroxide-simeth (MAALOX/MYLANTA) 200-200-20 MG/5ML suspension 30 mL (30 mLs Oral Given 07/21/22 1257)  famotidine (PEPCID) tablet 40 mg (40 mg Oral Given 07/21/22 1256)    ED Course/ Medical Decision Making/ A&P Clinical Course as of 07/21/22 1547  Thu Jul 21, 2022  1451 Patient reassessed and has given improvement of his chest upset burning sensation after the GI medications.  Suggested more likely this is a reflux issue.  We did discuss his bradycardia.  I suspect this is likely been developing ongoing  chronic issue.  His heart rate on telemetry here average about 45 to 50 bpm while at rest.  Patient denies that he is having lightheaded or dizzy spells, particularly at a time when he is awake and active, therefore I do think he is reasonably stable for follow-up with his cardiologist early next week.  He is not on any rate controlling medications that could be discontinued at this time.  He and his wife are in agreement with the plan.  I have a much lower suspicion for ACS at this time [MT]    Clinical Course User Index [MT] Lain Tetterton, Kermit Balo, MD                             Medical Decision Making Amount and/or Complexity of Data Reviewed Labs: ordered. Radiology: ordered.  Risk OTC drugs.   This patient presents to the Emergency Department with complaint of chest pain. This involves an extensive number of treatment options, and is a complaint that carries with it a high risk of complications and morbidity, given the patient's comorbidity, including CAD .The differential diagnosis includes ACS vs Pneumothorax vs Reflux/Gastritis vs MSK pain vs Pneumonia vs other.  I felt PE was less likely given that symptoms have been intermittent on and off for months, he has no persistent tachycardia or tachypnea or hypoxia  I ordered, reviewed, and interpreted labs.  Pertinent results include no emergent findings I  ordered medication GI medications for suspected gastritis or esophagitis or reflux I ordered imaging studies which included x-ray of the chest I independently visualized and interpreted imaging which showed no acute emergent findings and the monitor tracing which showed sinus rhythm and sinus bradycardia. I agree with the radiologist interpretation Additional history was obtained from the patient's wife at bedside External records obtained and reviewed showing cardiology outpatient records I personally reviewed the patients ECG which showed sinus rhythm with no acute ischemic findings  After the interventions stated above, I reevaluated the patient and found that they were symptomatically improved in the respect of his chest discomfort, and otherwise asymptomatic.  Based on the patient's clinical exam, vital signs, risk factors, and ED testing, I felt that the patient's overall risk of life-threatening emergency such as ACS, PE, sepsis, or infection was low.  At this time, I felt the patient's presentation was most clinically consistent with asymptomatic bradycardia nonspecific chest discomfort, but explained to the patient that this evaluation was not a definitive diagnostic workup.  I discussed outpatient follow up with primary care provider, and provided specialist office number on the patient's discharge paper if a referral was deemed necessary.  Return precautions were discussed with the patient.  I felt the patient was clinically stable for discharge.         Final Clinical Impression(s) / ED Diagnoses Final diagnoses:  Bradycardia  Chest pain, unspecified type    Rx / DC Orders ED Discharge Orders          Ordered    famotidine (PEPCID) 20 MG tablet  2 times daily        07/21/22 1452              Terald Sleeper, MD 07/21/22 [phone]

## 2022-07-21 NOTE — Telephone Encounter (Signed)
STAT if HR is under 50 or over 120 (normal HR is 60-100 beats per minute)  What is your heart rate? 43 this morning  Do you have a log of your heart rate readings (document readings)? About three weeks ago pt went to the [facility] for to have his ears clean, pt's hr was 33 and they needed to bring it up to 48 before he could leave. [facility] and his hr was 43. Today his hr was 43 as well.   Do you have any other symptoms? Patient has been having tightness in their chest and they are having some burning in their chest as well.  Pt c/o of Chest Pain: STAT if CP now or developed within 24 hours  1. Are you having CP right now? No  2. Are you experiencing any other symptoms (ex. SOB, nausea, vomiting, sweating)? No  3. How long have you been experiencing CP? For a few days   4. Is your CP continuous or coming and going? Coming and going   5. Have you taken Nitroglycerin? No ?

## 2022-07-25 NOTE — Progress Notes (Unsigned)
Cardiology Office Note:    Date:  07/26/2022   ID:  Anthony Mcgee, DOB [birth year], MRN [id number]  PCP:  Anthony Berger, MD  Cardiologist:  Anthony Herrlich, MD    Referring MD: Anthony Berger, MD    ASSESSMENT:    1. Sinus bradycardia   2. Coronary artery disease involving native coronary artery of native heart with angina pectoris   3. S/P CABG (coronary artery bypass graft)   4. RBBB (right bundle branch block with left anterior fascicular block)   5. Essential hypertension   6. Dyslipidemia    PLAN:    In order of problems listed above:  He clearly has conduction system disease with longstanding bifascicular heart block but he has been relatively asymptomatic the question is whether his exercise intolerance was related to bradycardia and whether he has evidence of higher degree conduction system disease and needs to be considered for pacing having the best tools a 2-week ambulatory monitor and I would like to use a live version.  I do not think we need to stop his Requip avoid rate slowing medications Stable CAD having no anginal discomfort continue his current medical therapy Stable continue his current treatment at this time he is on no antihypertensive treatment Continue his statin  Next appointment: 3 months   Medication Adjustments/Labs and Tests Ordered: Current medicines are reviewed at length with the patient today.  Concerns regarding medicines are outlined above.  No orders of the defined types were placed in this encounter.  No orders of the defined types were placed in this encounter.   Chief complaint my heart rate was slow in urgent care  History of Present Illness:    Anthony Mcgee is a 77 y.o. male with a hx of CAD with CABG 2000 right bundle branch block and left anterior hemiblock hypertension type 2 diabetes hyperlipidemia claudication with normal ABIs last seen 07/14/2021.  He was recently seen Door ED at [facility] 07/21/2022  for chest discomfort.  He was also told in the [facility] that his heart rate has been less than 40 bpm and needs to follow-up with a cardiologist.  Seen in the emergency room his pulse rate was 46 bpm.  His EKG showed sinus rhythm 54 bpm with APCs right bundle branch block and left anterior hemiblock.  BMP was normal potassium 4.7 creatinine 1.12 GFR greater than 60 cc/min.  CBC was normal hemoglobin 13.7 platelets 165,000 chest x-ray shows COPD findings otherwise normal.  High-sensitivity troponin was quite low at 4.  Delta was not performed in the ED he was given a GI cocktail with relief of symptoms and was placed on an H2 blocker.  He is felt to be appropriate for further outpatient evaluation.  His previous EKG August 2020 showed resting sinus bradycardia 49 bpm.  His medications are reviewed he takes Requip which at times is associated with bradycardia  Compliance with diet, lifestyle and medications: Yes  His story begins with a trip to the beach she is not self-reported he has chronic problems with wax in his ears went to urgent care his heart rate was 33 to 43 bpm resulting in him calling our office going to the emergency room. He has not had syncope lightheadedness palpitation but he does describe exercise intolerance No new medications  ED told about having heartburn he was given Pepcid and had relief Past Medical History:  Diagnosis Date   Arthritis 09/16/2010   Carpal tunnel syndrome 10/14/2010  Coronary artery disease involving native coronary artery of native heart with angina pectoris 06/16/2015   Overview:  Lexiscan MPS 08/18/13: normal perfusion, EF 63%   Depression 09/16/2010   Dyslipidemia 06/16/2015   Essential hypertension 09/16/2010   Headache 09/16/2010   RBBB (right bundle branch block with left anterior fascicular block) 06/16/2015   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   aspirin EC 81 MG tablet Take 81 mg by mouth daily.   CINNAMON PO Take 1 capsule by mouth daily.   Cyanocobalamin (VITAMIN B-12 PO) Take 1 tablet by mouth daily.   famotidine (PEPCID) 20 MG tablet Take 1 tablet (20 mg total) by mouth 2 (two) times daily.   metFORMIN (GLUCOPHAGE) 500 MG tablet Take 500 mg by mouth 2 (two) times daily with a meal.    montelukast (SINGULAIR) 10 MG tablet Take 10 mg by mouth daily.   nitroGLYCERIN (NITROSTAT) 0.4 MG SL tablet Place 1 tablet (0.4 mg total) under the tongue every 5 (five) minutes as needed.   Omega-3 Fatty Acids (FISH OIL) 1000 MG CAPS Take 1 capsule by mouth daily.   pregabalin (LYRICA) 150 MG capsule Take 150 mg by mouth 2 (two) times daily.   rOPINIRole (REQUIP) 1 MG tablet Take 1 mg by mouth at bedtime.    simvastatin (ZOCOR) 20 MG tablet Take 20 mg by mouth daily at 6 PM.    tamsulosin (FLOMAX) 0.4 MG CAPS capsule Take 0.4 mg by mouth daily.    traMADol (ULTRAM) 50 MG tablet TAKE 1 TO 2 TABLETS BY MOUTH TWICE DAILY AS NEEDED FOR SEVERE PAIN   trandolapril (MAVIK) 4 MG tablet Take 4 mg by mouth daily.    Vitamin D, Ergocalciferol, 50 MCG (2000 UT) CAPS Take 1 tablet by mouth daily.   Current Facility-Administered Medications for the 07/26/22 encounter (Office Visit) with Baldo Daub, MD  Medication   triamcinolone acetonide (KENALOG) 10 MG/ML injection 10 mg   triamcinolone acetonide (KENALOG-40) injection 20 mg     Allergies:   Codeine and Strawberry extract   Social History   Socioeconomic History   Marital status: Married    Spouse name: Not on file   Number of children: Not on file   Years of education: Not on file   Highest education level: Not on file   Occupational History   Not on file  Tobacco Use   Smoking status: Former    Passive exposure: Past   Smokeless tobacco: Never  Vaping Use   Vaping Use: Never used  Substance and Sexual Activity   Alcohol use: Not Currently   Drug use: Not Currently   Sexual activity: Not on file  Other Topics Concern   Not on file  Social History Narrative   Not on file   Social Determinants of Health   Financial Resource Strain: Not on file  Food Insecurity: Not on file  Transportation Needs: Not on file  Physical Activity: Not on file  Stress: Not on file  Social Connections: Not on file     Family History: The patient's  sinus bradycardia 47 bpm family history includes Heart attack in his father; Heart failure in his father and mother; Hyperlipidemia in his father and mother; Hypertension in his father and mother. ROS:   Please see the history of present illness.    All other systems reviewed and are negative.  EKGs/Labs/Other Studies Reviewed:    The following studies were reviewed today:      EKG:  EKG ordered today and personally reviewed.  The ekg ordered today demonstrates APC is seen and again has a pattern of bifascicular heart block QRS duration is quite prolonged at 166 ms  Recent Labs: 07/21/2022: BUN 16; Creatinine, Ser 1.12; Hemoglobin 13.7; Platelets 165; Potassium 4.7; Sodium 138    Physical Exam:    VS:  BP (!) 110/58 (BP Location: Right Arm, Patient Position: Sitting)   Pulse (!) 47   Ht  (1.702 m)   Wt 185 lb 12.8 oz (84.3 kg)   SpO2 97%   BMI 29.10 kg/m     Wt Readings from Last 3 Encounters:  07/26/22 185 lb 12.8 oz (84.3 kg)  07/21/22 188 lb 15 oz (85.7 kg)  07/14/21 189 lb (85.7 kg)     GEN:  Well nourished, well developed in no acute distress HEENT: Normal NECK: No JVD; No carotid bruits LYMPHATICS: No lymphadenopathy CARDIAC: RRR, no murmurs, rubs, gallops RESPIRATORY:  Clear to auscultation without rales, wheezing or rhonchi  ABDOMEN:  Soft, non-tender, non-distended MUSCULOSKELETAL:  No edema; No deformity  SKIN: Warm and dry NEUROLOGIC:  Alert and oriented x 3 PSYCHIATRIC:  Normal affect    Signed, Anthony Herrlich, MD  07/26/2022 8:02 AM    [facility]

## 2022-07-26 ENCOUNTER — Encounter: Payer: Self-pay | Admitting: Cardiology

## 2022-07-26 ENCOUNTER — Ambulatory Visit: Payer: Medicare HMO | Attending: Cardiology

## 2022-07-26 ENCOUNTER — Ambulatory Visit: Payer: Medicare HMO | Attending: Cardiology | Admitting: Cardiology

## 2022-07-26 VITALS — BP 110/58 | HR 47 | Ht 67.0 in | Wt 185.8 lb

## 2022-07-26 DIAGNOSIS — I452 Bifascicular block: Secondary | ICD-10-CM

## 2022-07-26 DIAGNOSIS — I10 Essential (primary) hypertension: Secondary | ICD-10-CM

## 2022-07-26 DIAGNOSIS — E785 Hyperlipidemia, unspecified: Secondary | ICD-10-CM

## 2022-07-26 DIAGNOSIS — R001 Bradycardia, unspecified: Secondary | ICD-10-CM

## 2022-07-26 DIAGNOSIS — Z951 Presence of aortocoronary bypass graft: Secondary | ICD-10-CM

## 2022-07-26 DIAGNOSIS — I25119 Atherosclerotic heart disease of native coronary artery with unspecified angina pectoris: Secondary | ICD-10-CM | POA: Diagnosis not present

## 2022-07-26 NOTE — Patient Instructions (Signed)
Medication Instructions:  Your physician recommends that you continue on your current medications as directed. Please refer to the Current Medication list given to you today.  *If you need a refill on your cardiac medications before your next appointment, please call your pharmacy*   Lab Work: None If you have labs (blood work) drawn today and your tests are completely normal, you will receive your results only by: MyChart Message (if you have MyChart) OR A paper copy in the mail If you have any lab test that is abnormal or we need to change your treatment, we will call you to review the results.   Testing/Procedures: A zio monitor was ordered today. It will remain on for 14 days. You will then return monitor and event diary in provided box. It takes 1-2 weeks for report to be downloaded and returned to us. We will call you with the results. If monitor falls off or has orange flashing light, please call Zio for further instructions.     Follow-Up: At [facility], you and your health needs are our priority.  As part of our continuing mission to provide you with exceptional heart care, we have created designated Provider Care Teams.  These Care Teams include your primary Cardiologist (physician) and Advanced Practice Providers (APPs -  Physician Assistants and Nurse Practitioners) who all work together to provide you with the care you need, when you need it.  We recommend signing up for the patient portal called "MyChart".  Sign up information is provided on this After Visit Summary.  MyChart is used to connect with patients for Virtual Visits (Telemedicine).  Patients are able to view lab/test results, encounter notes, upcoming appointments, etc.  Non-urgent messages can be sent to your provider as well.   To learn more about what you can do with MyChart, go to [email or web address].    Your next appointment:   3 month(s)  Provider:   Brian Munley, MD    Other  Instructions None  

## 2022-08-09 ENCOUNTER — Telehealth: Payer: Self-pay

## 2022-08-09 NOTE — Telephone Encounter (Signed)
Pre-op team,  Please inform patient and requesting office that we are awaiting results from 2 week zio.   Thank you!  DW

## 2022-08-09 NOTE — Telephone Encounter (Signed)
   Pre-operative Risk Assessment    Patient Name: Anthony Mcgee  DOB: [birth year] MRN: [id number]      Request for Surgical Clearance    Procedure:   Bleharoplasty, Both Upper Lids w/ Fat Pad / ptosis  Date of Surgery:  Clearance 08/17/22                                 Surgeon:  Dr Art Buff Surgeon's Group or Practice Name:  [facility] Phone number:  [phone]  ex: [phone] Fax number:  [phone]   Type of Clearance Requested:   - Medical    Type of Anesthesia:   IV Sedation   Additional requests/questions:  Please fax a copy of completed form prior to patients date of surgery to the surgeon's office.  Signed, Viviano Simas   08/09/2022, 9:22 AM

## 2022-08-10 ENCOUNTER — Telehealth: Payer: Self-pay | Admitting: Cardiology

## 2022-08-10 NOTE — Telephone Encounter (Signed)
Routed Dr. Debbe Bales offce  via epic with clearance notes.

## 2022-08-10 NOTE — Telephone Encounter (Signed)
Lvm that looks like monitor came off yesterday.  As soon as office gets results will let [facility] know.

## 2022-08-10 NOTE — Telephone Encounter (Signed)
Anthony Mcgee from [facility] is calling because they received a fax about waiting for [facility] Results before giving clearance. Anthony Mcgee is requesting to know how long until we get those results from  the [facility]. Anthony Mcgee is wanting to make sure they do not need to reschedule the procedure.

## 2022-08-16 ENCOUNTER — Telehealth: Payer: Self-pay | Admitting: Cardiology

## 2022-08-16 NOTE — Telephone Encounter (Signed)
[facility] is calling to get update on this clearance. Please advise.

## 2022-08-16 NOTE — Telephone Encounter (Signed)
I will let requesting office know via fax.

## 2022-08-16 NOTE — Telephone Encounter (Signed)
   Patient Name: Anthony Mcgee  DOB: [birth year] MRN: [id number]  Primary Cardiologist: None  Chart reviewed as part of pre-operative protocol coverage. Pre-op clearance already addressed by colleagues in earlier phone notes. To summarize recommendations:  - I just spoke with Dr. Bing Matter, he reviewed the the [facility] monitor, says ok to proceed with surgery.    Will route this bundled recommendation to requesting provider via Epic fax function and remove from pre-op pool. Please call with questions.  Sharlene Dory, PA-C 08/16/2022, 3:55 PM

## 2022-08-16 NOTE — Telephone Encounter (Signed)
I WILL RE-FAX ALL NOTES IN REGARD THE CLEARANCE TO FAX # [phone]. LOOKS LIKE WE HAVE FAXED A NUMBER OF TIMES.

## 2022-08-16 NOTE — Telephone Encounter (Signed)
Morrie Sheldon with [facility] states they still have not received the clearance. She would like it faxed to them again. Fax: [phone]

## 2022-08-16 NOTE — Telephone Encounter (Signed)
Said that patient is supposed to have eye surgery tomorrow and do not want to postpone it again since they postponed it once before. Says that [facility] have not received a fax yet. Would like for a callback and also to re-fax clearance to [phone]

## 2022-08-18 ENCOUNTER — Telehealth: Payer: Self-pay

## 2022-08-18 NOTE — Telephone Encounter (Signed)
Patient notified of results.

## 2022-08-18 NOTE — Telephone Encounter (Signed)
-----   Message from Baldo Daub, MD sent at 08/17/2022  5:54 PM EDT ----- Good result no need for pacemaker.

## 2022-10-20 ENCOUNTER — Other Ambulatory Visit: Payer: Self-pay

## 2022-10-20 DIAGNOSIS — M6281 Muscle weakness (generalized): Secondary | ICD-10-CM | POA: Insufficient documentation

## 2022-10-20 DIAGNOSIS — I259 Chronic ischemic heart disease, unspecified: Secondary | ICD-10-CM | POA: Insufficient documentation

## 2022-10-20 DIAGNOSIS — M545 Low back pain, unspecified: Secondary | ICD-10-CM | POA: Insufficient documentation

## 2022-10-20 DIAGNOSIS — H43812 Vitreous degeneration, left eye: Secondary | ICD-10-CM | POA: Insufficient documentation

## 2022-10-20 DIAGNOSIS — M1909 Primary osteoarthritis, other specified site: Secondary | ICD-10-CM | POA: Insufficient documentation

## 2022-10-20 DIAGNOSIS — E782 Mixed hyperlipidemia: Secondary | ICD-10-CM | POA: Insufficient documentation

## 2022-10-20 DIAGNOSIS — R2689 Other abnormalities of gait and mobility: Secondary | ICD-10-CM

## 2022-10-24 NOTE — Progress Notes (Unsigned)
Cardiology Office Note:    Date:  10/25/2022   ID:  Anthony Mcgee, DOB May 20, 1945, MRN [id number]  PCP:  Maris Berger, MD  Cardiologist:  Norman Herrlich, MD    Referring MD: Maris Berger, MD    ASSESSMENT:    1. Sinus bradycardia   2. APC (atrial premature contractions)   3. Coronary artery disease involving native coronary artery of native heart with angina pectoris (HCC)   4. S/P CABG (coronary artery bypass graft)   5. RBBB (right bundle branch block with left anterior fascicular block)   6. Essential hypertension   7. Dyslipidemia    PLAN:    In order of problems listed above:  Fortunately he has no severe bradycardia no sinus pauses no second or third-degree AV block I did tell him in the future to speak to physicians about prescriptions to be sure it is not rate slowing medications such as eyedrops for glaucoma I think he benefit from a smart watch to monitor for significant bradycardia.  I would not give him suppressant therapy and antiarrhythmic drug.  For his atrial arrhythmia. Stable CAD having no anginal discomfort after bypass surgery on current medication continue aspirin lipid-lowering With simvastatin Stable no indication of significant bradycardia with his bifascicular heart block Well-controlled BP is at target continue his current treatment Lipids at target continue his statin   Next appointment: 6 months   Medication Adjustments/Labs and Tests Ordered: Current medicines are reviewed at length with the patient today.  Concerns regarding medicines are outlined above.  No orders of the defined types were placed in this encounter.  No orders of the defined types were placed in this encounter.    History of Present Illness:    Anthony Mcgee is a 77 y.o. male with a hx of CAD with CABG in 2000 bifascicular heart block right bundle branch block left anterior block hypertension hyperlipidemia type 2 diabetes last seen that 07/26/2022.  Following that  visit an event monitor to assess bradycardia showing sinus rhythm average heart rate 55 bpm minimum 39 maximum 118 there were no sinus pauses or episodes of second or third-degree AV block.  Atrial premature contractions were frequent but there were no episodes of atrial fibrillation or flutter.  Recent labs 09/19/2022 from his PCP Free T4 normal 1.3 lipid profile cholesterol 128 LDL 69 CMP with a creatinine 1.11 GFR 69 cc/min sodium 130 potassium 4.9 A1c 6.1%.  Compliance with diet, lifestyle and medications: Yes  Anthony Mcgee is seen today along with his wife his biggest problem is ambulating unsteady gait but not having cardiovascular symptoms of edema shortness of breath orthopnea chest pain palpitation or syncope.  I asked him to get a smart watch with his relative bradycardia to monitor his heart rhythm. Past Medical History:  Diagnosis Date   Arthritis 09/16/2010   Arthropathy of lumbar facet joint 11/25/2020   Added automatically from request for surgery [id number]     BPH (benign prostatic hyperplasia) 01/08/2019   Last Assessment & Plan:    flomax     Carpal tunnel syndrome 10/14/2010   Chronic ischemic heart disease 10/20/2022   Closed fracture of thoracic vertebral body (HCC) 03/25/2022   Compression fracture of T12 vertebra (HCC) 03/25/2022   Coronary artery disease involving native coronary artery of native heart with angina pectoris (HCC) 06/16/2015   Overview:  Lexiscan MPS 08/18/13: normal perfusion, EF 63%   Depression 09/16/2010   Dyslipidemia 06/16/2015   Dysuria 01/08/2019   Essential hypertension 09/16/2010  Fever 07/29/2019   Fever due to infection 07/29/2019   GERD (gastroesophageal reflux disease) 09/28/2017   Headache 09/16/2010   History of gout 08/26/2020   Last Assessment & Plan:    Allopurinol     HLD (hyperlipidemia) 08/26/2020   Last Assessment & Plan:    lipitor     Hx of CABG 01/08/2019   Intractable back pain 08/26/2020   Last Assessment & Plan:     Seen by [facility] at [facility] 08/24/2020 for chronic LBP and left upper buttock/anterior groin pain - recommended MRI wwo contrast to assess the degree of epidural fibrosis given his multiple previous lumbar surgeries and to follow-up on the bone lesions. Pending the MRI results, a more definitive plan of care to include his surgical options will be determined.       MR L spin   Intractable pain 10/28/2019   Leukocytosis 10/28/2019   Low back pain 10/20/2022   Mixed hyperlipidemia 10/20/2022   Muscle weakness (generalized) 10/20/2022   Other abnormalities of gait and mobility 10/20/2022   PAD (peripheral artery disease) (HCC) 11/09/2018   Pericarditis 09/28/2017   Post laminectomy syndrome 05/26/2021   Added automatically from request for surgery [id number]     RBBB (right bundle branch block with left anterior fascicular block) 06/16/2015   Restless legs syndrome (RLS) 08/26/2020   Last Assessment & Plan:    requip nightly   Ordered extra dose (half-strength) this morning     Sinus bradycardia 11/09/2018   Status post left knee replacement 01/07/2019   Thoracic or lumbosacral neuritis or radiculitis 09/20/2010   Trochanteric bursitis of left hip 05/02/2022   Type 2 diabetes mellitus (HCC) 09/16/2010   Urinary retention 01/08/2019   Vitreous degeneration, left eye 10/20/2022    Current Medications: Current Meds  Medication Sig   allopurinol (ZYLOPRIM) 300 MG tablet Take 300 mg by mouth daily.   aspirin EC 81 MG tablet Take 81 mg by mouth daily.   famotidine (PEPCID) 20 MG tablet Take 20 mg by mouth as needed for heartburn or indigestion.   finasteride (PROSCAR) 5 MG tablet Take 5 mg by mouth daily.   furosemide (LASIX) 20 MG tablet Take 20 mg by mouth as needed for fluid or edema.   glimepiride (AMARYL) 2 MG tablet Take 2 mg by mouth as needed (when blood suger is over 180).   metFORMIN (GLUCOPHAGE) 1000 MG tablet Take 1,000 mg by mouth 2 (two) times daily.   nitroGLYCERIN (NITROSTAT) 0.4  MG SL tablet Place 0.4 mg under the tongue every 5 (five) minutes as needed for chest pain.   Omega-3 Fatty Acids (FISH OIL) 1000 MG CAPS Take 1 capsule by mouth daily.   pregabalin (LYRICA) 150 MG capsule Take 150 mg by mouth 3 (three) times daily.   simvastatin (ZOCOR) 20 MG tablet Take 20 mg by mouth daily at 6 PM.    tamsulosin (FLOMAX) 0.4 MG CAPS capsule Take 0.4 mg by mouth daily.    traMADol (ULTRAM) 50 MG tablet TAKE 1 TO 2 TABLETS BY MOUTH TWICE DAILY AS NEEDED FOR SEVERE PAIN   trandolapril (MAVIK) 4 MG tablet Take 4 mg by mouth daily.    Current Facility-Administered Medications for the 10/25/22 encounter (Office Visit) with Baldo Daub, MD  Medication   triamcinolone acetonide (KENALOG) 10 MG/ML injection 10 mg   triamcinolone acetonide (KENALOG-40) injection 20 mg      EKGs/Labs/Other Studies Reviewed:    The following studies were reviewed today:  Cardiac Studies &  Procedures         MONITORS  LONG TERM MONITOR-LIVE TELEMETRY (3-14 DAYS) 08/16/2022  Narrative Patch Wear Time:  13 days and 6 hours (2024-04-23T08:24:01-399 to 2024-05-06T14:55:43-0400)  Patient had a min HR of 39 bpm, max HR of 118 bpm, and avg HR of 55 bpm. Predominant underlying rhythm was Sinus Rhythm. Bundle Branch Block/IVCD was present.  There were 25 triggered events all sinus rhythm and associated with APCs at times frequent.  There were no pauses of 3 seconds or greater and no episodes of second or third-degree AV nodal block.  2 Supraventricular Tachycardia runs occurred, the run with the fastest interval lasting 5 beats with a max rate of 118 bpm (avg 99 bpm); the run with the fastest interval was also the longest.  Isolated SVEs were frequent (9.8%, 95499), SVE Couplets were rare (<1.0%, 153), and SVE Triplets were rare (<1.0%, 28).  There were no episodes of atrial fibrillation or flutter.  Isolated VEs were rare (<1.0%), and no VE Couplets or VE Triplets were present.             Physical Exam:    VS:  BP 122/64   Pulse 60   Ht 5' 6.6" (1.692 m)   Wt 190 lb (86.2 kg)   SpO2 93%   BMI 30.12 kg/m     Wt Readings from Last 3 Encounters:  10/25/22 190 lb (86.2 kg)  07/26/22 185 lb 12.8 oz (84.3 kg)  07/21/22 188 lb 15 oz (85.7 kg)     GEN:  Well nourished, well developed in no acute distress HEENT: Normal NECK: No JVD; No carotid bruits LYMPHATICS: No lymphadenopathy CARDIAC: RRR, no murmurs, rubs, gallops RESPIRATORY:  Clear to auscultation without rales, wheezing or rhonchi  ABDOMEN: Soft, non-tender, non-distended MUSCULOSKELETAL:  No edema; No deformity  SKIN: Warm and dry NEUROLOGIC:  Alert and oriented x 3 PSYCHIATRIC:  Normal affect    Signed, Norman Herrlich, MD  10/25/2022 9:46 AM    [facility]

## 2022-10-25 ENCOUNTER — Ambulatory Visit: Payer: Medicare HMO | Attending: Cardiology | Admitting: Cardiology

## 2022-10-25 ENCOUNTER — Encounter: Payer: Self-pay | Admitting: Cardiology

## 2022-10-25 VITALS — BP 122/64 | HR 60 | Ht 66.6 in | Wt 190.0 lb

## 2022-10-25 DIAGNOSIS — R001 Bradycardia, unspecified: Secondary | ICD-10-CM

## 2022-10-25 DIAGNOSIS — I491 Atrial premature depolarization: Secondary | ICD-10-CM | POA: Diagnosis not present

## 2022-10-25 DIAGNOSIS — I25119 Atherosclerotic heart disease of native coronary artery with unspecified angina pectoris: Secondary | ICD-10-CM | POA: Diagnosis not present

## 2022-10-25 DIAGNOSIS — I10 Essential (primary) hypertension: Secondary | ICD-10-CM

## 2022-10-25 DIAGNOSIS — Z951 Presence of aortocoronary bypass graft: Secondary | ICD-10-CM | POA: Diagnosis not present

## 2022-10-25 DIAGNOSIS — I452 Bifascicular block: Secondary | ICD-10-CM

## 2022-10-25 DIAGNOSIS — E785 Hyperlipidemia, unspecified: Secondary | ICD-10-CM

## 2022-10-25 NOTE — Patient Instructions (Signed)
Medication Instructions:  Your physician recommends that you continue on your current medications as directed. Please refer to the Current Medication list given to you today.  *If you need a refill on your cardiac medications before your next appointment, please call your pharmacy*   Lab Work: None If you have labs (blood work) drawn today and your tests are completely normal, you will receive your results only by: MyChart Message (if you have MyChart) OR A paper copy in the mail If you have any lab test that is abnormal or we need to change your treatment, we will call you to review the results.   Testing/Procedures: None   Follow-Up: At [facility], you and your health needs are our priority.  As part of our continuing mission to provide you with exceptional heart care, we have created designated Provider Care Teams.  These Care Teams include your primary Cardiologist (physician) and Advanced Practice Providers (APPs -  Physician Assistants and Nurse Practitioners) who all work together to provide you with the care you need, when you need it.  We recommend signing up for the patient portal called "MyChart".  Sign up information is provided on this After Visit Summary.  MyChart is used to connect with patients for Virtual Visits (Telemedicine).  Patients are able to view lab/test results, encounter notes, upcoming appointments, etc.  Non-urgent messages can be sent to your provider as well.   To learn more about what you can do with MyChart, go to [email or web address].    Your next appointment:   6 month(s)  Provider:   Norman Herrlich, MD    Other Instructions Get a Fitbit for monitoring

## 2022-11-21 DIAGNOSIS — M48062 Spinal stenosis, lumbar region with neurogenic claudication: Secondary | ICD-10-CM | POA: Insufficient documentation

## 2023-06-08 DIAGNOSIS — M25521 Pain in right elbow: Secondary | ICD-10-CM | POA: Insufficient documentation

## 2023-06-08 DIAGNOSIS — Z7189 Other specified counseling: Secondary | ICD-10-CM | POA: Insufficient documentation

## 2023-06-08 DIAGNOSIS — H2513 Age-related nuclear cataract, bilateral: Secondary | ICD-10-CM | POA: Insufficient documentation

## 2023-06-08 DIAGNOSIS — J441 Chronic obstructive pulmonary disease with (acute) exacerbation: Secondary | ICD-10-CM | POA: Insufficient documentation

## 2023-06-09 ENCOUNTER — Ambulatory Visit: Payer: Medicare HMO | Attending: Cardiology | Admitting: Cardiology

## 2023-06-09 ENCOUNTER — Encounter: Payer: Self-pay | Admitting: Cardiology

## 2023-06-09 VITALS — BP 110/62 | HR 54 | Ht 66.0 in | Wt 192.0 lb

## 2023-06-09 DIAGNOSIS — R001 Bradycardia, unspecified: Secondary | ICD-10-CM

## 2023-06-09 DIAGNOSIS — E785 Hyperlipidemia, unspecified: Secondary | ICD-10-CM

## 2023-06-09 DIAGNOSIS — I10 Essential (primary) hypertension: Secondary | ICD-10-CM

## 2023-06-09 DIAGNOSIS — I25119 Atherosclerotic heart disease of native coronary artery with unspecified angina pectoris: Secondary | ICD-10-CM

## 2023-06-09 DIAGNOSIS — I452 Bifascicular block: Secondary | ICD-10-CM

## 2023-06-09 NOTE — Patient Instructions (Signed)
Medication Instructions:  Your physician recommends that you continue on your current medications as directed. Please refer to the Current Medication list given to you today.  *If you need a refill on your cardiac medications before your next appointment, please call your pharmacy*   Lab Work: Your physician recommends that you return for lab work in:   Labs today: CMP, Lipids  If you have labs (blood work) drawn today and your tests are completely normal, you will receive your results only by: MyChart Message (if you have MyChart) OR A paper copy in the mail If you have any lab test that is abnormal or we need to change your treatment, we will call you to review the results.   Testing/Procedures: None   Follow-Up: At [facility], you and your health needs are our priority.  As part of our continuing mission to provide you with exceptional heart care, we have created designated Provider Care Teams.  These Care Teams include your primary Cardiologist (physician) and Advanced Practice Providers (APPs -  Physician Assistants and Nurse Practitioners) who all work together to provide you with the care you need, when you need it.  We recommend signing up for the patient portal called "MyChart".  Sign up information is provided on this After Visit Summary.  MyChart is used to connect with patients for Virtual Visits (Telemedicine).  Patients are able to view lab/test results, encounter notes, upcoming appointments, etc.  Non-urgent messages can be sent to your provider as well.   To learn more about what you can do with MyChart, go to [email or web address].    Your next appointment:   1 year(s)  Provider:   Brian Munley, MD    Other Instructions None  

## 2023-06-09 NOTE — Progress Notes (Signed)
 Cardiology Office Note:    Date:  06/09/2023   ID:  Anthony Mcgee, DOB 1946/01/09, MRN [id number]  PCP:  Maris Berger, MD  Cardiologist:  Norman Herrlich, MD    Referring MD: Maris Berger, MD    ASSESSMENT:    1. Coronary artery disease involving native coronary artery of native heart with angina pectoris (HCC)   2. Essential hypertension   3. RBBB (right bundle branch block with left anterior fascicular block)   4. Sinus bradycardia   5. Dyslipidemia    PLAN:    In order of problems listed above: And will continue Roen continues to do well with CAD following remote PCI having no anginal discomfort of medical therapy including his aspirin and lipid-lowering with intermediate intensity statin.  At this time I would not advise an ischemia evaluation Stable EKG pattern bifascicular heart block I asked him to activate the smart watch ability to monitor his rhythm Blood pressure at target continue his current treatment loop diuretic is needed I will check a lipid profile today and his CMP   Next appointment: 1 year   Medication Adjustments/Labs and Tests Ordered: Current medicines are reviewed at length with the patient today.  Concerns regarding medicines are outlined above.  Orders Placed This Encounter  Procedures   EKG 12-Lead   No orders of the defined types were placed in this encounter.    History of Present Illness:    Anthony Mcgee is a 78 y.o. 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 Past Medical History:  Diagnosis Date   Arthritis 09/16/2010   Arthropathy of lumbar facet joint 11/25/2020   Added automatically from request for surgery [id number]     BPH (benign prostatic hyperplasia) 01/08/2019   Last Assessment & Plan:    flomax     Carpal tunnel syndrome 10/14/2010   Chronic ischemic heart disease 10/20/2022   Closed fracture of thoracic vertebral body (HCC) 03/25/2022   Compression fracture of T12 vertebra (HCC) 03/25/2022   Coronary artery disease involving native coronary artery of native heart with angina pectoris (HCC) 06/16/2015   Overview:  Lexiscan MPS 08/18/13: normal perfusion, EF 63%   Depression 09/16/2010   Dyslipidemia 06/16/2015   Dysuria 01/08/2019   Essential hypertension 09/16/2010   Fever 07/29/2019   Fever due to infection 07/29/2019   GERD (gastroesophageal reflux disease) 09/28/2017   Headache 09/16/2010   History of gout 08/26/2020   Last Assessment & Plan:    Allopurinol     HLD (hyperlipidemia) 08/26/2020   Last Assessment & Plan:    lipitor     Hx of CABG 01/08/2019   Intractable back pain 08/26/2020   Last Assessment & Plan:    Seen by [facility]U at [facility] 08/24/2020 for chronic LBP and left upper buttock/anterior groin pain - recommended  MRI wwo contrast to assess the degree of epidural fibrosis given his multiple previous lumbar surgeries and to follow-up on the bone lesions. Pending the MRI results, a more definitive plan of care to include his surgical options will be determined.       MR L spin   Intractable pain 10/28/2019   Leukocytosis 10/28/2019   Low back pain 10/20/2022   Mixed hyperlipidemia 10/20/2022   Muscle weakness (generalized) 10/20/2022   Other abnormalities of gait and mobility 10/20/2022   PAD  (peripheral artery disease) (HCC) 11/09/2018   Pericarditis 09/28/2017   Post laminectomy syndrome 05/26/2021   Added automatically from request for surgery [id number]     RBBB (right bundle branch block with left anterior fascicular block) 06/16/2015   Restless legs syndrome (RLS) 08/26/2020   Last Assessment & Plan:    requip nightly   Ordered extra dose (half-strength) this morning     Sinus bradycardia 11/09/2018   Status post left knee replacement 01/07/2019   Thoracic or lumbosacral neuritis or radiculitis 09/20/2010   Trochanteric bursitis of left hip 05/02/2022   Type 2 diabetes mellitus (HCC) 09/16/2010   Urinary retention 01/08/2019   Vitreous degeneration, left eye 10/20/2022    Current Medications: Current Meds  Medication Sig   allopurinol (ZYLOPRIM) 300 MG tablet Take 300 mg by mouth daily.   aspirin EC 81 MG tablet Take 81 mg by mouth daily.   famotidine (PEPCID) 20 MG tablet Take 20 mg by mouth as needed for heartburn or indigestion.   finasteride (PROSCAR) 5 MG tablet Take 5 mg by mouth daily.   furosemide (LASIX) 20 MG tablet Take 20 mg by mouth as needed for fluid or edema.   glimepiride (AMARYL) 2 MG tablet Take 2 mg by mouth as needed (when blood suger is over 180).   metFORMIN (GLUCOPHAGE) 1000 MG tablet Take 1,000 mg by mouth 2 (two) times daily.   nitroGLYCERIN (NITROSTAT) 0.4 MG SL tablet Place 0.4 mg under the tongue every 5 (five) minutes as needed for chest pain.   Omega-3 Fatty Acids (FISH OIL) 1000 MG CAPS Take 1 capsule by mouth daily.   pioglitazone (ACTOS) 15 MG tablet Take 15 mg by mouth daily.   pregabalin (LYRICA) 150 MG capsule Take 150 mg by mouth 3 (three) times daily.   simvastatin (ZOCOR) 20 MG tablet Take 20 mg by mouth daily at 6 PM.    tamsulosin (FLOMAX) 0.4 MG CAPS capsule Take 0.4 mg by mouth daily.    traMADol (ULTRAM) 50 MG tablet Take 50-100 mg by mouth every 12 (twelve) hours as needed for moderate pain (pain score 4-6) or severe pain  (pain score 7-10).   trandolapril (MAVIK) 4 MG tablet Take 4 mg by mouth daily.    Current Facility-Administered Medications for the 06/09/23 encounter (Office Visit) with Baldo Daub, MD  Medication   triamcinolone acetonide (KENALOG) 10 MG/ML injection 10 mg   triamcinolone acetonide (KENALOG-40) injection 20 mg      EKGs/Labs/Other Studies Reviewed:    The following studies were reviewed today:  Cardiac Studies & Procedures   ______________________________________________________________________________________________        Anthony Mcgee  LONG TERM MONITOR-LIVE TELEMETRY (3-14 DAYS) 08/16/2022  Narrative Patch Wear Time:  13 days and 6 hours (2024-04-23T08:24:01-399 to 2024-05-06T14:55:43-0400)  Patient had a min HR of 39 bpm, max HR of 118 bpm, and avg HR of 55 bpm. Predominant underlying rhythm was Sinus Rhythm. Bundle Branch Block/IVCD was present.  There were 25 triggered events  all sinus rhythm and associated with APCs at times frequent.  There were no pauses of 3 seconds or greater and no episodes of second or third-degree AV nodal block.  2 Supraventricular Tachycardia runs occurred, the run with the fastest interval lasting 5 beats with a max rate of 118 bpm (avg 99 bpm); the run with the fastest interval was also the longest.  Isolated SVEs were frequent (9.8%, 95499), SVE Couplets were rare (<1.0%, 153), and SVE Triplets were rare (<1.0%, 28).  There were no episodes of atrial fibrillation or flutter.  Isolated VEs were rare (<1.0%), and no VE Couplets or VE Triplets were present.       ______________________________________________________________________________________________     His EKG shows the same pattern of sinus bradycardia bifascicular heart block    Recent Labs: 07/21/2022: BUN 16; Creatinine, Ser 1.12; Hemoglobin 13.7; Platelets 165; Potassium 4.7; Sodium 138  Recent Lipid Panel No results found for: "CHOL", "TRIG", "HDL", "CHOLHDL", "VLDL",  "LDLCALC", "LDLDIRECT"  Physical Exam:    VS:  BP 110/62 (BP Location: Right Arm, Patient Position: Sitting)   Pulse (!) 54   Ht 5\' 6"  (1.676 m)   Wt 192 lb (87.1 kg)   SpO2 94%   BMI 30.99 kg/m     Wt Readings from Last 3 Encounters:  06/09/23 192 lb (87.1 kg)  10/25/22 190 lb (86.2 kg)  07/26/22 185 lb 12.8 oz (84.3 kg)     GEN:  Well nourished, well developed in no acute distress HEENT: Normal NECK: No JVD; No carotid bruits LYMPHATICS: No lymphadenopathy CARDIAC: RRR, no murmurs, rubs, gallops RESPIRATORY:  Clear to auscultation without rales, wheezing or rhonchi  ABDOMEN: Soft, non-tender, non-distended MUSCULOSKELETAL:  No edema; No deformity  SKIN: Warm and dry NEUROLOGIC:  Alert and oriented x 3 PSYCHIATRIC:  Normal affect    Signed, Norman Herrlich, MD  06/09/2023 3:45 PM    [facility]

## 2023-06-09 NOTE — Addendum Note (Signed)
 Addended by: Roxanne Mins I on: 06/09/2023 04:00 PM   Modules accepted: Orders

## 2023-06-10 ENCOUNTER — Telehealth: Payer: Self-pay | Admitting: Internal Medicine

## 2023-06-10 LAB — COMPREHENSIVE METABOLIC PANEL
ALT: 36 IU/L (ref 0–44)
AST: 26 IU/L (ref 0–40)
Albumin: 4.6 g/dL (ref 3.8–4.8)
Alkaline Phosphatase: 55 IU/L (ref 44–121)
BUN/Creatinine Ratio: 17 (ref 10–24)
BUN: 23 mg/dL (ref 8–27)
Bilirubin Total: 0.4 mg/dL (ref 0.0–1.2)
CO2: 19 mmol/L — ABNORMAL LOW (ref 20–29)
Calcium: 9.3 mg/dL (ref 8.6–10.2)
Chloride: 104 mmol/L (ref 96–106)
Creatinine, Ser: 1.37 mg/dL — ABNORMAL HIGH (ref 0.76–1.27)
Globulin, Total: 2 g/dL (ref 1.5–4.5)
Glucose: 93 mg/dL (ref 70–99)
Potassium: 5.9 mmol/L (ref 3.5–5.2)
Sodium: 140 mmol/L (ref 134–144)
Total Protein: 6.6 g/dL (ref 6.0–8.5)
eGFR: 53 mL/min/{1.73_m2} — ABNORMAL LOW (ref >59)

## 2023-06-10 LAB — LIPID PANEL
Chol/HDL Ratio: 2.9 ratio (ref 0.0–5.0)
Cholesterol, Total: 132 mg/dL (ref 100–199)
HDL: 45 mg/dL (ref >39)
LDL Chol Calc (NIH): 62 mg/dL (ref 0–99)
Triglycerides: 143 mg/dL (ref 0–149)
VLDL Cholesterol Cal: 25 mg/dL (ref 5–40)

## 2023-06-10 NOTE — Plan of Care (Signed)
 Paged by [facility] for K 5.9. Discussed with patient. Plan to be evaluated for repeat labs in urgent care/ED.    Eustaquio Boyden, MD MS

## 2023-06-10 NOTE — Telephone Encounter (Signed)
 Paged by [facility] for K 5.9. Discussed with patient. Plan to be evaluated for repeat labs in urgent care/ED.    Anthony Boyden, MD MS
